# Patient Record
Sex: Female | Born: 1985 | Race: White | Hispanic: Yes | Marital: Married | Smoking: Former smoker
Health system: Southern US, Community
[De-identification: ages and names within clinical notes are randomized; demographics above are authoritative.]

## PROBLEM LIST (undated history)

## (undated) DIAGNOSIS — E079 Disorder of thyroid, unspecified: Secondary | ICD-10-CM

## (undated) DIAGNOSIS — Q2112 Patent foramen ovale: Secondary | ICD-10-CM

## (undated) DIAGNOSIS — Q211 Atrial septal defect: Secondary | ICD-10-CM

## (undated) DIAGNOSIS — F419 Anxiety disorder, unspecified: Secondary | ICD-10-CM

## (undated) DIAGNOSIS — R51 Headache: Secondary | ICD-10-CM

## (undated) DIAGNOSIS — I10 Essential (primary) hypertension: Secondary | ICD-10-CM

## (undated) DIAGNOSIS — G43909 Migraine, unspecified, not intractable, without status migrainosus: Secondary | ICD-10-CM

## (undated) DIAGNOSIS — F32A Depression, unspecified: Secondary | ICD-10-CM

## (undated) DIAGNOSIS — F329 Major depressive disorder, single episode, unspecified: Secondary | ICD-10-CM

## (undated) DIAGNOSIS — T884XXA Failed or difficult intubation, initial encounter: Secondary | ICD-10-CM

## (undated) DIAGNOSIS — Z302 Encounter for sterilization: Secondary | ICD-10-CM

## (undated) HISTORY — PX: CRYOTHERAPY: SHX1416

## (undated) HISTORY — DX: Anxiety disorder, unspecified: F41.9

---

## 2005-08-11 ENCOUNTER — Emergency Department (HOSPITAL_COMMUNITY): Admission: EM | Admit: 2005-08-11 | Discharge: 2005-08-11 | Payer: Self-pay | Admitting: Emergency Medicine

## 2005-08-17 ENCOUNTER — Encounter: Admission: RE | Admit: 2005-08-17 | Discharge: 2005-09-05 | Payer: Self-pay | Admitting: Family Medicine

## 2006-01-26 ENCOUNTER — Other Ambulatory Visit: Admission: RE | Admit: 2006-01-26 | Discharge: 2006-01-26 | Payer: Self-pay | Admitting: Gynecology

## 2007-01-11 HISTORY — PX: DILATION AND EVACUATION: SHX1459

## 2007-02-24 ENCOUNTER — Inpatient Hospital Stay (HOSPITAL_COMMUNITY): Admission: AD | Admit: 2007-02-24 | Discharge: 2007-02-24 | Payer: Self-pay | Admitting: Obstetrics and Gynecology

## 2007-02-27 ENCOUNTER — Ambulatory Visit (HOSPITAL_COMMUNITY): Admission: RE | Admit: 2007-02-27 | Discharge: 2007-02-27 | Payer: Self-pay | Admitting: Obstetrics and Gynecology

## 2007-02-27 ENCOUNTER — Encounter (INDEPENDENT_AMBULATORY_CARE_PROVIDER_SITE_OTHER): Payer: Self-pay | Admitting: Obstetrics and Gynecology

## 2007-08-21 ENCOUNTER — Other Ambulatory Visit: Admission: RE | Admit: 2007-08-21 | Discharge: 2007-08-21 | Payer: Self-pay | Admitting: Gynecology

## 2007-10-22 ENCOUNTER — Ambulatory Visit: Payer: Self-pay | Admitting: Gynecology

## 2008-01-11 HISTORY — PX: WISDOM TOOTH EXTRACTION: SHX21

## 2008-01-28 ENCOUNTER — Encounter: Admission: RE | Admit: 2008-01-28 | Discharge: 2008-01-28 | Payer: Self-pay | Admitting: Gastroenterology

## 2009-01-10 HISTORY — PX: CHOLECYSTECTOMY OPEN: SUR202

## 2009-01-22 ENCOUNTER — Ambulatory Visit (HOSPITAL_COMMUNITY): Admission: AD | Admit: 2009-01-22 | Discharge: 2009-01-22 | Payer: Self-pay | Admitting: Obstetrics and Gynecology

## 2009-01-23 ENCOUNTER — Ambulatory Visit (HOSPITAL_COMMUNITY): Admission: RE | Admit: 2009-01-23 | Discharge: 2009-01-23 | Payer: Self-pay | Admitting: Obstetrics and Gynecology

## 2009-02-03 ENCOUNTER — Encounter (INDEPENDENT_AMBULATORY_CARE_PROVIDER_SITE_OTHER): Payer: Self-pay | Admitting: Obstetrics and Gynecology

## 2009-02-03 ENCOUNTER — Inpatient Hospital Stay (HOSPITAL_COMMUNITY): Admission: AD | Admit: 2009-02-03 | Discharge: 2009-02-05 | Payer: Self-pay | Admitting: Obstetrics and Gynecology

## 2009-02-23 ENCOUNTER — Encounter: Admission: RE | Admit: 2009-02-23 | Discharge: 2009-02-23 | Payer: Self-pay | Admitting: Gastroenterology

## 2009-03-06 ENCOUNTER — Emergency Department (HOSPITAL_COMMUNITY): Admission: EM | Admit: 2009-03-06 | Discharge: 2009-03-06 | Payer: Self-pay | Admitting: Emergency Medicine

## 2009-03-08 ENCOUNTER — Emergency Department (HOSPITAL_COMMUNITY): Admission: EM | Admit: 2009-03-08 | Discharge: 2009-03-08 | Payer: Self-pay | Admitting: Emergency Medicine

## 2009-03-10 ENCOUNTER — Ambulatory Visit (HOSPITAL_COMMUNITY): Admission: RE | Admit: 2009-03-10 | Discharge: 2009-03-10 | Payer: Self-pay | Admitting: General Surgery

## 2010-01-06 LAB — HEPATITIS B SURFACE ANTIGEN: Hepatitis B Surface Ag: NEGATIVE

## 2010-01-06 LAB — TYPE AND SCREEN: Antibody Screen: NEGATIVE

## 2010-01-06 LAB — ABO/RH

## 2010-01-10 NOTE — L&D Delivery Note (Signed)
Delivery Note At 5:55 PM a viable female was delivered via Vaginal, Spontaneous Delivery (Presentation: Right Occiput Anterior).  APGAR: 8, 9; weight 6 lb 12.1 oz (3065 g).   Placenta status: Intact, Spontaneous.  Cord: 3 vessels with the following complications: None.  Cord pH: not done  Anesthesia: Epidural  Episiotomy: None Lacerations: 1st degree Suture Repair: 3-0 vicryl rapide Est. Blood Loss (mL): 300  Mom to postpartum.  Baby to nursery-stable.  Kristen Klein J 08/06/2010, 6:06 PM

## 2010-03-28 LAB — COMPREHENSIVE METABOLIC PANEL
Alkaline Phosphatase: 127 U/L — ABNORMAL HIGH (ref 39–117)
BUN: 8 mg/dL (ref 6–23)
Chloride: 103 mEq/L (ref 96–112)
Creatinine, Ser: 0.41 mg/dL (ref 0.4–1.2)
GFR calc Af Amer: >60 mL/min (ref >60)
Glucose, Bld: 91 mg/dL (ref 70–99)
Potassium: 3.8 mEq/L (ref 3.5–5.1)
Total Bilirubin: 0.7 mg/dL (ref 0.3–1.2)
Total Protein: 6.5 g/dL (ref 6.0–8.3)

## 2010-03-28 LAB — CBC
HCT: 39 % (ref 36.0–46.0)
Hemoglobin: 12.4 g/dL (ref 12.0–15.0)
Hemoglobin: 13.2 g/dL (ref 12.0–15.0)
MCHC: 33.9 g/dL (ref 30.0–36.0)
MCV: 86.2 fL (ref 78.0–100.0)
MCV: 86.6 fL (ref 78.0–100.0)
Platelets: 194 10*3/uL (ref 150–400)
RBC: 4.24 MIL/uL (ref 3.87–5.11)
RDW: 14.7 % (ref 11.5–15.5)
WBC: 14.9 10*3/uL — ABNORMAL HIGH (ref 4.0–10.5)

## 2010-03-28 LAB — URIC ACID: Uric Acid, Serum: 5.6 mg/dL (ref 2.4–7.0)

## 2010-04-02 LAB — LIPASE, BLOOD: Lipase: 30 U/L (ref 11–59)

## 2010-04-02 LAB — COMPREHENSIVE METABOLIC PANEL
ALT: 33 U/L (ref 0–35)
Alkaline Phosphatase: 158 U/L — ABNORMAL HIGH (ref 39–117)
BUN: 12 mg/dL (ref 6–23)
CO2: 25 mEq/L (ref 19–32)
Chloride: 102 mEq/L (ref 96–112)
GFR calc Af Amer: >60 mL/min (ref >60)
Glucose, Bld: 98 mg/dL (ref 70–99)
Potassium: 3.4 mEq/L — ABNORMAL LOW (ref 3.5–5.1)
Total Bilirubin: 0.5 mg/dL (ref 0.3–1.2)

## 2010-04-02 LAB — CBC
HCT: 42.9 % (ref 36.0–46.0)
MCV: 84.5 fL (ref 78.0–100.0)
Platelets: 277 10*3/uL (ref 150–400)
RDW: 13.4 % (ref 11.5–15.5)

## 2010-04-02 LAB — DIFFERENTIAL
Basophils Absolute: 0 10*3/uL (ref 0.0–0.1)
Basophils Relative: 0 % (ref 0–1)
Lymphocytes Relative: 41 % (ref 12–46)
Monocytes Absolute: 0.4 10*3/uL (ref 0.1–1.0)
Monocytes Relative: 5 % (ref 3–12)
Neutro Abs: 4.6 10*3/uL (ref 1.7–7.7)
Neutrophils Relative %: 52 % (ref 43–77)

## 2010-04-02 LAB — URINALYSIS, ROUTINE W REFLEX MICROSCOPIC
Glucose, UA: NEGATIVE mg/dL
Nitrite: NEGATIVE
Specific Gravity, Urine: 1.014 (ref 1.005–1.030)
pH: 6.5 (ref 5.0–8.0)

## 2010-05-25 NOTE — Op Note (Signed)
[name], Kristen Klein           ACCOUNT NO.:  [id number]   MEDICAL RECORD NO.:  [id number]          PATIENT TYPE:  AMB   LOCATION:  SDC                           FACILITY:  [facility]   PHYSICIAN:  Malva Limes, M.D.    DATE OF BIRTH:  1985-05-26   DATE OF PROCEDURE:  02/27/2007  DATE OF DISCHARGE:                               OPERATIVE REPORT   PREOPERATIVE DIAGNOSIS:  Spontaneous abortion.   POSTOPERATIVE DIAGNOSIS:  Spontaneous abortion.   PROCEDURE PERFORMED:  Dilation and evacuation.   SURGEON:  Malva Limes, M.D.   ANESTHESIA:  MAC with paracervical block.   DRAINS:  None.   ANTIBIOTICS:  Ancef 1 gram.   SPECIMENS:  Products of conception sent to pathology.   ESTIMATED BLOOD LOSS:  30 mL.   DESCRIPTION OF PROCEDURE:  The patient was taken to the operating room  where she was placed in the dorsal lithotomy position.  A MAC anesthesia  was then administered.  She was prepped and draped in the usual fashion  for this procedure.  A sterile speculum was placed in the vagina.  Lidocaine 1% 20 mL was used for a paracervical block.  A single toothed  tenaculum was applied to the anterior cervical lip.  The cervix was  serially dilated to a 74 [place].  A 7 mm suction cannula was placed into  the uterine cavity and the products of conception were withdrawn.  Sharp  curettage was performed followed by repeat suction.  The patient was  taken to the Recovery Room in stable condition.  Instrument and lap  counts were correct times one.  Patient will be discharged to home.  She  will be given RhoGAM if RH negative.           ______________________________  Malva Limes, M.D.     MA/MEDQ  D:  02/27/2007  T:  02/27/2007  Job:  [id number]

## 2010-06-22 IMAGING — RF DG CHOLANGIOGRAM OPERATIVE
1 series · 4 of 4 positions shown · non-contrast
Comparison: None

CLINICAL DATA: Symptomatic cholecystitis

INTRAOPERATIVE CHOLANGIOGRAM
TECHNIQUE: Cholangiographic images from the C-arm fluoroscopic
device were submitted for interpretation post-operatively.  Please
see the procedural report for the amount of contrast and the
fluoroscopy time utilized.

[Series 1: run · 4 of 31 frames shown]
[frame 5/31]
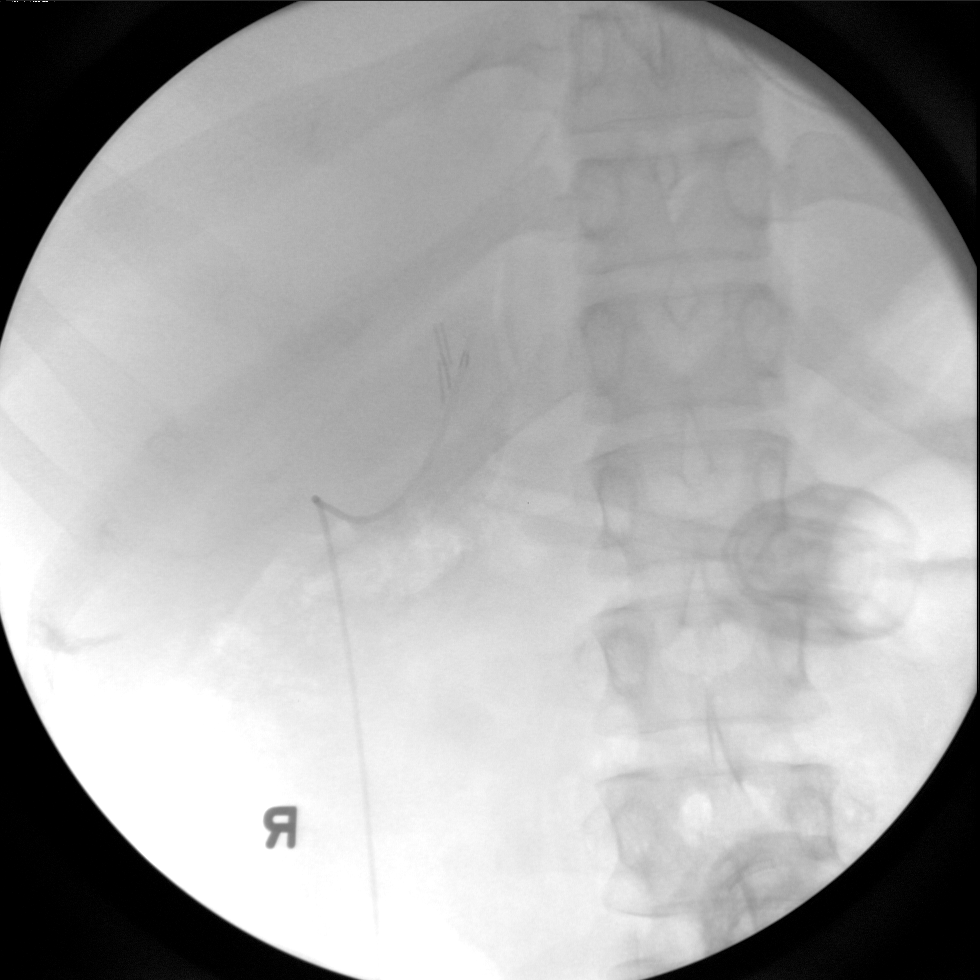
[frame 16/31]
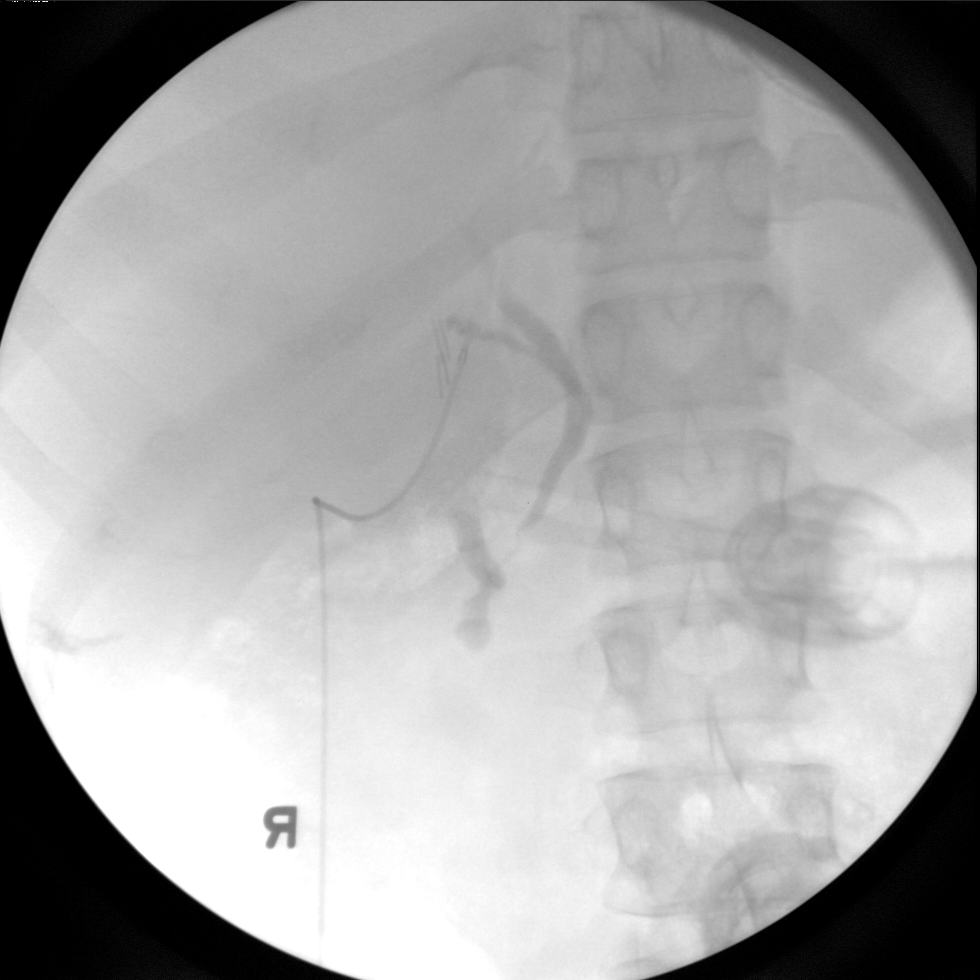
[frame 27/31]
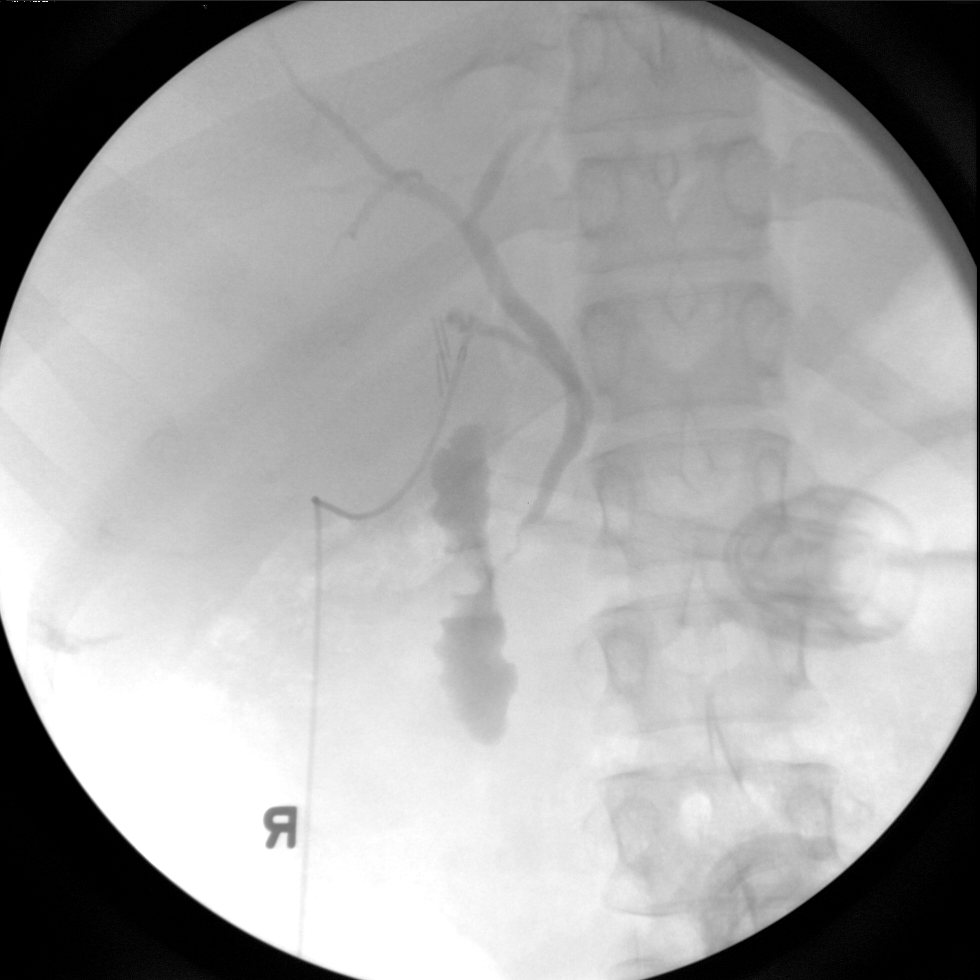
[frame 31/31]
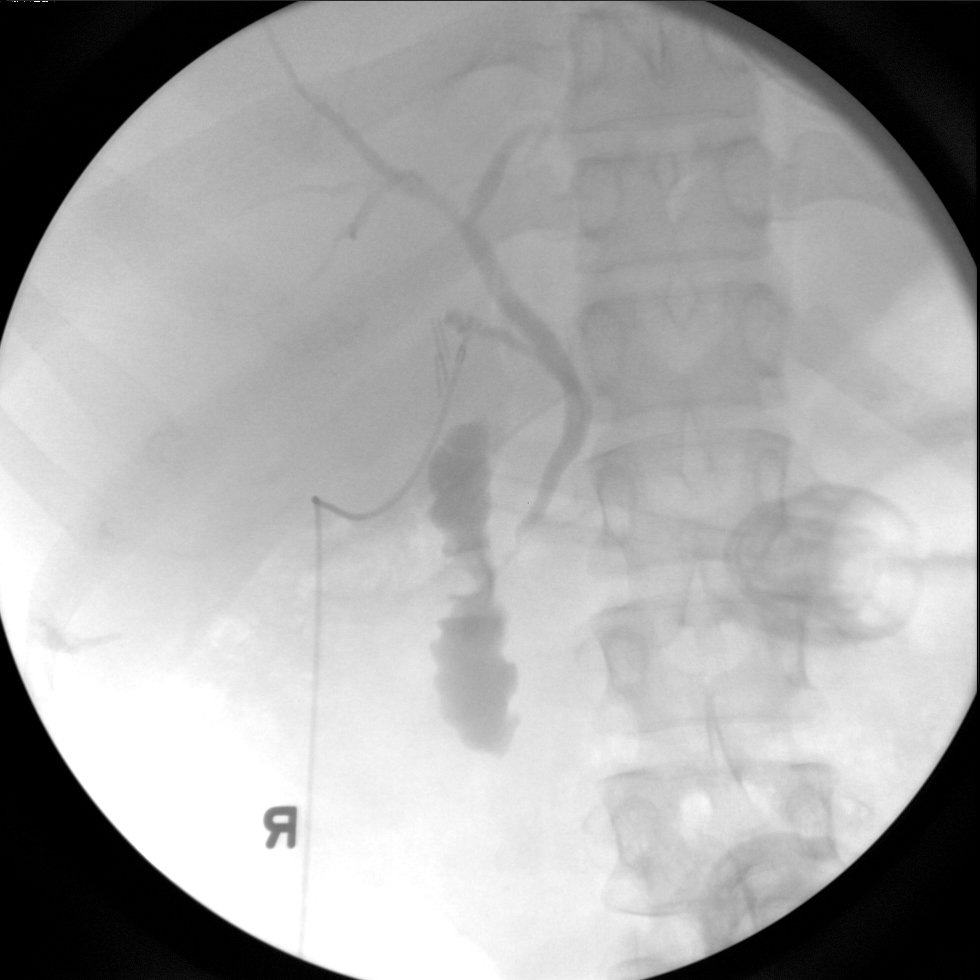

[4 of 4 positions shown; findings below may reference images not displayed]

FINDINGS: No persistent filling defects in the common duct.
Intrahepatic ducts are incompletely visualized, appearing
decompressed centrally. Contrast passes into the duodenum.

IMPRESSION

Negative for retained common duct stone.

## 2010-07-26 ENCOUNTER — Other Ambulatory Visit (HOSPITAL_COMMUNITY): Payer: Self-pay

## 2010-08-06 ENCOUNTER — Inpatient Hospital Stay (HOSPITAL_COMMUNITY): Admitting: Anesthesiology

## 2010-08-06 ENCOUNTER — Encounter (HOSPITAL_COMMUNITY): Payer: Self-pay

## 2010-08-06 ENCOUNTER — Inpatient Hospital Stay (HOSPITAL_COMMUNITY)
Admission: RE | Admit: 2010-08-06 | Discharge: 2010-08-08 | DRG: 775 | Disposition: A | Discharge status: Home / Self Care | Source: Ambulatory Visit | Attending: Obstetrics and Gynecology | Admitting: Obstetrics and Gynecology

## 2010-08-06 ENCOUNTER — Encounter (HOSPITAL_COMMUNITY): Payer: Self-pay | Admitting: Anesthesiology

## 2010-08-06 DIAGNOSIS — O139 Gestational [pregnancy-induced] hypertension without significant proteinuria, unspecified trimester: Principal | ICD-10-CM | POA: Diagnosis present

## 2010-08-06 HISTORY — DX: Essential (primary) hypertension: I10

## 2010-08-06 LAB — COMPREHENSIVE METABOLIC PANEL
AST: 24 U/L (ref 0–37)
BUN: 8 mg/dL (ref 6–23)
CO2: 21 mEq/L (ref 19–32)
Calcium: 9.8 mg/dL (ref 8.4–10.5)
Chloride: 100 mEq/L (ref 96–112)
Creatinine, Ser: <0.47 mg/dL — ABNORMAL LOW (ref 0.50–1.10)
Glucose, Bld: 80 mg/dL (ref 70–99)
Total Bilirubin: 0.4 mg/dL (ref 0.3–1.2)

## 2010-08-06 LAB — CBC
HCT: 38.8 % (ref 36.0–46.0)
Hemoglobin: 13.6 g/dL (ref 12.0–15.0)
MCH: 29.7 pg (ref 26.0–34.0)
MCHC: 35.1 g/dL (ref 30.0–36.0)
MCV: 84.7 fL (ref 78.0–100.0)
RBC: 4.58 MIL/uL (ref 3.87–5.11)

## 2010-08-06 LAB — URIC ACID: Uric Acid, Serum: 5.6 mg/dL (ref 2.4–7.0)

## 2010-08-06 MED ORDER — ONDANSETRON HCL 4 MG/2ML IJ SOLN: 4.0000 mg | Freq: Four times a day (QID) | INTRAMUSCULAR | Status: DC | PRN

## 2010-08-06 MED ORDER — FENTANYL 2.5 MCG/ML BUPIVACAINE 1/10 % EPIDURAL INFUSION (WH - ANES)
14.0000 mL/h | INTRAMUSCULAR | Status: DC
  Administered 2010-08-06 (×3): 14 mL/h via EPIDURAL
  Filled 2010-08-06 (×3): qty 60

## 2010-08-06 MED ORDER — LANOLIN HYDROUS EX OINT: TOPICAL_OINTMENT | CUTANEOUS | Status: DC | PRN

## 2010-08-06 MED ORDER — OXYTOCIN 20 UNITS IN LACTATED RINGERS INFUSION - SIMPLE: 125.0000 mL/h | INTRAVENOUS | Status: DC | PRN

## 2010-08-06 MED ORDER — LIDOCAINE HCL (PF) 1 % IJ SOLN: 30.0000 mL | INTRAMUSCULAR | Status: DC | PRN

## 2010-08-06 MED ORDER — PRENATAL PLUS 27-1 MG PO TABS
1.0000 | ORAL_TABLET | Freq: Every day | ORAL | Status: DC
  Administered 2010-08-07 – 2010-08-08 (×2): 1 via ORAL
  Filled 2010-08-06 (×2): qty 1

## 2010-08-06 MED ORDER — EPHEDRINE 5 MG/ML INJ
10.0000 mg | INTRAVENOUS | Status: DC | PRN
  Administered 2010-08-06: 10 mg via INTRAVENOUS
  Filled 2010-08-06 (×2): qty 4

## 2010-08-06 MED ORDER — NALBUPHINE SYRINGE 5 MG/0.5 ML: 5.0000 mg | INJECTION | INTRAMUSCULAR | Status: DC | PRN

## 2010-08-06 MED ORDER — CITRIC ACID-SODIUM CITRATE 334-500 MG/5ML PO SOLN: 30.0000 mL | ORAL | Status: DC | PRN

## 2010-08-06 MED ORDER — PHENYLEPHRINE 40 MCG/ML (10ML) SYRINGE FOR IV PUSH (FOR BLOOD PRESSURE SUPPORT)
80.0000 ug | PREFILLED_SYRINGE | INTRAVENOUS | Status: DC | PRN
  Filled 2010-08-06: qty 5

## 2010-08-06 MED ORDER — LACTATED RINGERS IV SOLN: 500.0000 mL | Freq: Once | INTRAVENOUS | Status: DC

## 2010-08-06 MED ORDER — LACTATED RINGERS IV SOLN
500.0000 mL | INTRAVENOUS | Status: DC | PRN
  Administered 2010-08-06: 1000 mL via INTRAVENOUS

## 2010-08-06 MED ORDER — ONDANSETRON HCL 4 MG/2ML IJ SOLN: 4.0000 mg | INTRAMUSCULAR | Status: DC | PRN

## 2010-08-06 MED ORDER — ZOLPIDEM TARTRATE 5 MG PO TABS: 5.0000 mg | ORAL_TABLET | Freq: Every evening | ORAL | Status: DC | PRN

## 2010-08-06 MED ORDER — TETANUS-DIPHTH-ACELL PERTUSSIS 5-2.5-18.5 LF-MCG/0.5 IM SUSP: 0.5000 mL | Freq: Once | INTRAMUSCULAR | Status: DC

## 2010-08-06 MED ORDER — DIPHENHYDRAMINE HCL 25 MG PO CAPS: 25.0000 mg | ORAL_CAPSULE | Freq: Four times a day (QID) | ORAL | Status: DC | PRN

## 2010-08-06 MED ORDER — OXYTOCIN 20 UNITS IN LACTATED RINGERS INFUSION - SIMPLE
125.0000 mL/h | Freq: Once | INTRAVENOUS | Status: DC
  Administered 2010-08-06: 850 mL/h via INTRAVENOUS

## 2010-08-06 MED ORDER — OXYCODONE-ACETAMINOPHEN 5-325 MG PO TABS: 2.0000 | ORAL_TABLET | ORAL | Status: DC | PRN

## 2010-08-06 MED ORDER — PHENYLEPHRINE 40 MCG/ML (10ML) SYRINGE FOR IV PUSH (FOR BLOOD PRESSURE SUPPORT)
80.0000 ug | PREFILLED_SYRINGE | INTRAVENOUS | Status: DC | PRN
  Filled 2010-08-06 (×2): qty 5

## 2010-08-06 MED ORDER — EPHEDRINE 5 MG/ML INJ
10.0000 mg | INTRAVENOUS | Status: DC | PRN
  Filled 2010-08-06: qty 4

## 2010-08-06 MED ORDER — DIPHENHYDRAMINE HCL 50 MG/ML IJ SOLN: 12.5000 mg | INTRAMUSCULAR | Status: DC | PRN

## 2010-08-06 MED ORDER — FLEET ENEMA 7-19 GM/118ML RE ENEM: 1.0000 | ENEMA | RECTAL | Status: DC | PRN

## 2010-08-06 MED ORDER — SENNOSIDES-DOCUSATE SODIUM 8.6-50 MG PO TABS
1.0000 | ORAL_TABLET | Freq: Every day | ORAL | Status: DC
  Administered 2010-08-07: 1 via ORAL

## 2010-08-06 MED ORDER — ACETAMINOPHEN 325 MG PO TABS: 650.0000 mg | ORAL_TABLET | ORAL | Status: DC | PRN

## 2010-08-06 MED ORDER — SIMETHICONE 80 MG PO CHEW
80.0000 mg | CHEWABLE_TABLET | ORAL | Status: DC | PRN
  Administered 2010-08-06: 80 mg via ORAL

## 2010-08-06 MED ORDER — LACTATED RINGERS IV SOLN
INTRAVENOUS | Status: DC
  Administered 2010-08-06: 15:00:00 via INTRAVENOUS
  Administered 2010-08-06: 125 mL/h via INTRAVENOUS
  Administered 2010-08-06: 08:00:00 via INTRAVENOUS

## 2010-08-06 MED ORDER — OXYCODONE-ACETAMINOPHEN 5-325 MG PO TABS
1.0000 | ORAL_TABLET | ORAL | Status: DC | PRN
  Administered 2010-08-07: 1 via ORAL
  Filled 2010-08-06: qty 1

## 2010-08-06 MED ORDER — TERBUTALINE SULFATE 1 MG/ML IJ SOLN: 0.2500 mg | Freq: Once | INTRAMUSCULAR | Status: DC | PRN

## 2010-08-06 MED ORDER — LIDOCAINE HCL 1.5 % IJ SOLN
INTRAMUSCULAR | Status: DC | PRN
  Administered 2010-08-06: 5 mL
  Administered 2010-08-06: 2 mL
  Administered 2010-08-06: 5 mL

## 2010-08-06 MED ORDER — IBUPROFEN 600 MG PO TABS
600.0000 mg | ORAL_TABLET | Freq: Four times a day (QID) | ORAL | Status: DC
  Administered 2010-08-06 – 2010-08-08 (×7): 600 mg via ORAL
  Filled 2010-08-06 (×7): qty 1

## 2010-08-06 MED ORDER — IBUPROFEN 600 MG PO TABS
600.0000 mg | ORAL_TABLET | Freq: Four times a day (QID) | ORAL | Status: DC | PRN
  Administered 2010-08-06: 600 mg via ORAL
  Filled 2010-08-06: qty 1

## 2010-08-06 MED ORDER — BENZOCAINE-MENTHOL 20-0.5 % EX AERO: 1.0000 "application " | INHALATION_SPRAY | CUTANEOUS | Status: DC | PRN

## 2010-08-06 MED ORDER — ONDANSETRON HCL 4 MG PO TABS: 4.0000 mg | ORAL_TABLET | ORAL | Status: DC | PRN

## 2010-08-06 MED ORDER — WITCH HAZEL-GLYCERIN EX PADS: MEDICATED_PAD | CUTANEOUS | Status: DC | PRN

## 2010-08-06 MED ORDER — OXYTOCIN 20 UNITS IN LACTATED RINGERS INFUSION - SIMPLE
1.0000 m[IU]/min | INTRAVENOUS | Status: DC
  Administered 2010-08-06: 2 m[IU]/min via INTRAVENOUS
  Filled 2010-08-06: qty 1000

## 2010-08-06 NOTE — H&P (Signed)
Kristen Klein, Kristen Klein            ACCOUNT NO.:  [id number]  MEDICAL RECORD NO.:  [id number]  LOCATION:  9165                          FACILITY:  [facility]  PHYSICIAN:  Lenoard Aden, M.D.DATE OF BIRTH:  06/26/1985  DATE OF ADMISSION:  08/06/2010 DATE OF DISCHARGE:                             HISTORY & PHYSICAL   CHIEF COMPLAINT:  Gestational versus chronic hypertension at 39 weeks for induction.  HISTORY OF PRESENT ILLNESS:  She is a 25 year old Hispanic female G2, P1 at 14 weeks' gestation, who presents now for induction.  She has a past medical history remarkable for medications to include Procardia and prenatal vitamins.  No known drug allergies.  MEDICAL HISTORY:  Remarkable for preterm birth and hypertension associated with pregnancy.  SOCIAL HISTORY:  Noncontributory.  FAMILY HISTORY:  Remarkable for osteoarthritis, diabetes, chronic hypertension, and thyroid disease.  She has a history of a SAB x1 and a spontaneous vaginal delivery of 34 weeks.  SURGICAL HISTORY:  Include cholecystectomy, wisdom tooth extraction, cryosurgery, and D&C for missed AB in 2009.  Prenatal course has been complicated by hypertension for which she was placed on Procardia and maintained and preterm cervical change.  PHYSICAL EXAMINATION:  GENERAL:  She is a well-developed, well-nourished Hispanic female in no acute distress.  HEENT:  Normal. NECK:  Supple.  Full range of motion. LUNGS:  Clear. HEART:  Regular rhythm. ABDOMEN:  Soft, gravid, nontender.  Estimated fetal weight 7 pounds. PELVIS:  Cervix is 4- to 5-cm, 70% vertex -1. EXTREMITIES:  No cords. NEUROLOGIC:  Nonfocal. SKIN:  Intact.  IMPRESSION: 1. 39-week intrauterine pregnancy. 2. Gestational hypertension, well controlled on Procardia.  PLAN:  Admit, check PIH labs, epidural p.r.n., anticipate attempts at vaginal delivery.     Lenoard Aden, M.D.     RJT/MEDQ  D:  08/06/2010  T:  08/06/2010  Job:  [id number]

## 2010-08-06 NOTE — Anesthesia Preprocedure Evaluation (Signed)
Anesthesia Evaluation  Name, MR# and DOB Patient awake  General Assessment Comment  Reviewed: Allergy & Precautions, H&P , Patient's Chart, lab work & pertinent test results and reviewed documented beta blocker date and time   History of Anesthesia Complications Negative for: history of anesthetic complications  Airway Mallampati: II TM Distance: >3 FB     Dental  (+) Teeth Intact   Pulmonaryneg pulmonary ROS    clear to auscultation    Cardiovascular hypertension (preeclampsia), regular Normal   Neuro/PsychNegative Neurological ROS Negative Psych ROS  GI/Hepatic/Renal negative GI ROS, negative Liver ROS, and negative Renal ROS (+)       Endo/Other   (+)  Morbid obesity Abdominal   Musculoskeletal  Hematology negative hematology ROS (+)   Peds  Reproductive/Obstetrics (+) Pregnancy   Anesthesia Other Findings             Anesthesia Physical Anesthesia Plan  ASA: III  Anesthesia Plan: Epidural   Post-op Pain Management:    Induction:   Airway Management Planned:   Additional Equipment:   Intra-op Plan:   Post-operative Plan:   Informed Consent: I have reviewed the patients History and Physical, chart, labs and discussed the procedure including the risks, benefits and alternatives for the proposed anesthesia with the patient or authorized representative who has indicated his/her understanding and acceptance.     Plan Discussed with:   Anesthesia Plan Comments:         Anesthesia Quick Evaluation

## 2010-08-06 NOTE — Anesthesia Procedure Notes (Addendum)
Epidural Patient location during procedure: OB Start time: 08/06/2010 10:23 AM Reason for block: procedure for pain  Staffing Anesthesiologist: Luisantonio Adinolfi L. Performed by: anesthesiologist   Preanesthetic Checklist Completed: patient identified, site marked, surgical consent, pre-op evaluation, timeout performed, IV checked, risks and benefits discussed and monitors and equipment checked  Epidural Patient position: sitting Prep: site prepped and draped and DuraPrep Patient monitoring: continuous pulse ox, blood pressure and heart rate Approach: midline Injection technique: LOR air  Needle:  Needle type: Tuohy  Needle gauge: 17 G Needle length: 9 cm Needle insertion depth: 5 cm cm Catheter type: closed end flexible Catheter size: 19 Gauge Catheter at skin depth: 9 cm Test dose: negative  Assessment Events: blood not aspirated, injection not painful, no injection resistance, negative IV test and no paresthesia  Additional Notes  Heme+ in catheter - cath withdrawn to 9 cm at skin with no heme and negative test dose.  Discussed risk of headache, infection, bleeding, nerve injury and failed or incomplete block.  Patient voices understanding and wishes to proceed.

## 2010-08-06 NOTE — Progress Notes (Signed)
Kristen Klein is a 25 y.o. [id number] at [redacted]w[redacted]d by LMP admitted for induction of labor due to Gestational HTN on Procardia.  Subjective:No complaints  Objective: VSS- afebrile      FHT:  FHR: 150s bpm, variability: moderate,  accelerations:  Present,  decelerations:  Absent UC:   none SVE:   Dilation: 4 Effacement (%): 80 Station: -1 Exam by:: Dr. Billy Coast  Labs: Lab Results  Component Value Date   WBC 11.0* 08/06/2010   HGB 13.6 08/06/2010   HCT 38.8 08/06/2010   MCV 84.7 08/06/2010   PLT 186 08/06/2010    Assessment / Plan: Induction of labor due to gestational hypertension,  progressing well on pitocin  Labor: Progressing normally Preeclampsia:  no signs or symptoms of toxicity Fetal Wellbeing:  Category I Pain Control:  Labor support without medications I/D:  n/a Anticipated MOD:  NSVD  Shynice Sigel J 08/06/2010, 8:51 AM

## 2010-08-07 LAB — CBC
Hemoglobin: 12.3 g/dL (ref 12.0–15.0)
MCH: 29.6 pg (ref 26.0–34.0)
MCHC: 34.7 g/dL (ref 30.0–36.0)
MCV: 85.3 fL (ref 78.0–100.0)
RBC: 4.15 MIL/uL (ref 3.87–5.11)

## 2010-08-07 NOTE — Progress Notes (Signed)
  PPD 1 SVD Female / Gestational hypertension (off procardia)  S:  Reports feeling well / no PIH symptoms             Tolerating po/ No nausea or vomiting             Bleeding is light             Pain controlled withprescription NSAID's including motrin             Up ad lib / ambulatory  Newborn breast feeding  / Circumcision requested today   O:  A & O x 3 NAD             VS: Blood pressure 112/77, pulse 98, temperature 97.6 F (36.4 C), temperature source Oral, resp. rate 18, SpO2 98.00%, unknown if currently breastfeeding.  LABS:  Basename 08/07/10 0511 08/06/10 0820  HGB 12.3 13.6  HCT 35.4* 38.8    I&O: I/O last 3 completed shifts: In: -  Out: 900 [Urine:600; Blood:300]  Net outpt - negative      Lungs: Clear and unlabored  Heart: regular rate and rhythm / no mumurs  Abdomen: soft, non-tender, non-distended, active BS             Fundus: firm, non-tender, U-1  Perineum: mild edema  Lochia: light  Extremities: 1+ edema, no calf pain or tenderness, negative Homans    A/P: PPD # 1 SVD Female / breastfeeding              Gestational hypertension - delivered   Doing well - stable status  Routine post partum orders  Newborn circumcision today by Dr Billy Coast             Anticipate discharge in am - consider HCTZ with increased dependent edema  Kristen Klein 08/07/2010, 10:06 AM

## 2010-08-07 NOTE — Anesthesia Postprocedure Evaluation (Signed)
  Anesthesia Post-op Note  Patient: Kristen Klein  Procedure(s) Performed: * No procedures listed *  Patient stable following vaginal delivery.

## 2010-08-08 MED ORDER — IBUPROFEN 600 MG PO TABS: 600.0000 mg | ORAL_TABLET | Freq: Four times a day (QID) | ORAL | Status: AC

## 2010-08-08 NOTE — Progress Notes (Signed)
  PPD 2 SVD  S:  Reports feeling well             Tolerating po/ No nausea or vomiting             Bleeding is light             Pain controlled withprescription NSAID's including motrin only             Up ad lib / ambulatory  Newborn breast feeding  / Circumcision done   O:  A & O x 3 NAD             VS: Blood pressure 134/82, pulse 83, temperature 98.3 F (36.8 C), temperature source Oral, resp. rate 18, SpO2 98.00%, unknown if currently breastfeeding.  LABS:  Basename 08/07/10 0511 08/06/10 0820  HGB 12.3 13.6  HCT 35.4* 38.8    I&O: I/O last 3 completed shifts: In: -  Out: 600 [Urine:600]      Lungs: Clear and unlabored  Heart: regular rate and rhythm / no mumurs  Abdomen: soft, non-tender, non-distended              Fundus: firm, non-tender, U-2  Perineum: mild edema  Lochia: light  Extremities: slight edema, no calf pain or tenderness, negative Homans    A/P: PPD # 2  Doing well - stable status  Routine post partum orders  Discharge home  [facility] 08/08/2010, 7:57 AM

## 2010-08-08 NOTE — Discharge Summary (Signed)
  Obstetric Discharge Summary Reason for Admission: induction of labor  Prenatal Labs: ABO, Rh:   +/ Rhogam not indicated Antibody: Negative (12/28 0000) Rubella:   immune RPR: NON REACTIVE (07/27 0820)  HBsAg: Negative (12/28 0000)  HIV: Non-reactive (12/28 0000)  GBS: Negative (06/25 0000)   Labor Summary: Labor : Induction of labor with pitocin and AROM / normal labor progression with SVD. Delivery of viable female  via SVD by [place]  Anesthesia: epidural Procedures: none Complications: none  Newborn Data:  Gender: female Feeding method : breast Circumcision: yes Home with mother.  Discharge Information: Date: 08-08-2010 Discharge Diagnoses: Term Pregnancy-delivered / mild gestational hypertension - resolving  Activity: pelvic rest Diet: routine Medications: Ibuprophen Condition: stable Instructions: refer to practice specific booklet Discharge to: home Follow up : [facility] in 1 week for blood pressure check and  6 weeks for routine postpartum

## 2010-08-13 ENCOUNTER — Inpatient Hospital Stay (HOSPITAL_COMMUNITY): Admission: AD | Admit: 2010-08-13 | Payer: Self-pay | Source: Ambulatory Visit | Admitting: Obstetrics and Gynecology

## 2010-10-01 LAB — CBC
HCT: 36.9
MCHC: 35.2
MCV: 84.6
Platelets: 285
RDW: 13

## 2010-10-01 LAB — ABO/RH: ABO/RH(D): O POS

## 2010-12-24 ENCOUNTER — Encounter (HOSPITAL_COMMUNITY): Payer: Self-pay | Admitting: *Deleted

## 2010-12-24 ENCOUNTER — Emergency Department (HOSPITAL_COMMUNITY)
Admission: EM | Admit: 2010-12-24 | Discharge: 2010-12-24 | Disposition: A | Discharge status: Home / Self Care | Payer: BC Managed Care – PPO | Attending: Emergency Medicine | Admitting: Emergency Medicine

## 2010-12-24 ENCOUNTER — Other Ambulatory Visit: Payer: Self-pay

## 2010-12-24 ENCOUNTER — Emergency Department (HOSPITAL_COMMUNITY): Payer: BC Managed Care – PPO

## 2010-12-24 DIAGNOSIS — R079 Chest pain, unspecified: Secondary | ICD-10-CM | POA: Insufficient documentation

## 2010-12-24 DIAGNOSIS — R000 Tachycardia, unspecified: Secondary | ICD-10-CM

## 2010-12-24 DIAGNOSIS — I10 Essential (primary) hypertension: Secondary | ICD-10-CM | POA: Insufficient documentation

## 2010-12-24 LAB — URINALYSIS, ROUTINE W REFLEX MICROSCOPIC
Bilirubin Urine: NEGATIVE
Glucose, UA: NEGATIVE mg/dL
Ketones, ur: NEGATIVE mg/dL
Leukocytes, UA: NEGATIVE
Nitrite: NEGATIVE
Protein, ur: NEGATIVE mg/dL
Specific Gravity, Urine: 1.024 (ref 1.005–1.030)
Urobilinogen, UA: 0.2 mg/dL (ref 0.0–1.0)
pH: 5.5 (ref 5.0–8.0)

## 2010-12-24 LAB — DIFFERENTIAL
Basophils Absolute: 0 10*3/uL (ref 0.0–0.1)
Basophils Relative: 0 % (ref 0–1)
Eosinophils Absolute: 0.1 10*3/uL (ref 0.0–0.7)
Eosinophils Relative: 1 % (ref 0–5)
Lymphocytes Relative: 35 % (ref 12–46)
Lymphs Abs: 2.7 10*3/uL (ref 0.7–4.0)
Monocytes Absolute: 0.4 10*3/uL (ref 0.1–1.0)
Monocytes Relative: 5 % (ref 3–12)
Neutro Abs: 4.5 10*3/uL (ref 1.7–7.7)
Neutrophils Relative %: 58 % (ref 43–77)

## 2010-12-24 LAB — URINE MICROSCOPIC-ADD ON

## 2010-12-24 LAB — CBC
Hemoglobin: 13.8 g/dL (ref 12.0–15.0)
MCH: 29.7 pg (ref 26.0–34.0)
MCHC: 36 g/dL (ref 30.0–36.0)
RDW: 12.2 % (ref 11.5–15.5)

## 2010-12-24 LAB — D-DIMER, QUANTITATIVE: D-Dimer, Quant: <0.22 ug/mL-FEU (ref 0.00–0.48)

## 2010-12-24 MED ORDER — SODIUM CHLORIDE 0.9 % IV BOLUS (SEPSIS)
500.0000 mL | Freq: Once | INTRAVENOUS | Status: AC
  Administered 2010-12-24: 1000 mL via INTRAVENOUS

## 2010-12-24 MED ORDER — SODIUM CHLORIDE 0.9 % IV SOLN
INTRAVENOUS | Status: DC
  Administered 2010-12-24: 12:00:00 via INTRAVENOUS

## 2010-12-24 NOTE — ED Notes (Signed)
CHest pressure started yesterday while driving, radiated to shoulder and left arm, no nausea. Now pressure substernal  Radiates to back. No change with palpation, .

## 2010-12-24 NOTE — ED Provider Notes (Signed)
History     CSN: [id number] Arrival date & time: 12/24/2010 10:30 AM   First MD Initiated Contact with Patient 12/24/10 1108      Chief Complaint  Patient presents with  . Hypertension    (Consider location/radiation/quality/duration/timing/severity/associated sxs/prior treatment) Patient is a 25 y.o. female presenting with hypertension. The history is provided by the patient.  Hypertension Associated symptoms include chest pain. Pertinent negatives include no abdominal pain, no headaches and no shortness of breath.   patient states that she is having chest pain since Monday. It starts in her upper chest and radiates to the back and into her left arm. It is dull. She is 4 months postpartum. No cough. No real dyspnea. No fevers. No trauma. Around Thanksgiving time she states she had some cold symptoms. She's not had pains like this before. She states she's checking her blood pressure on Monday and spent elevated. She had a history of hypertension with pregnancy. Does not smoke. No swelling in her legs.   Past Medical History  Diagnosis Date  . Hypertension     Past Surgical History  Procedure Date  . Wisdom tooth extraction 2010  . Dilation and evacuation 2009  . Cholecystectomy open 2011    No family history on file.  History  Substance Use Topics  . Smoking status: Never Smoker   . Smokeless tobacco: Not on file  . Alcohol Use: No    OB History    Grav Para Term Preterm Abortions TAB SAB Ect Mult Living   3 2 1 1 1  0 1 0 0 2      Review of Systems  Constitutional: Negative for activity change and appetite change.  HENT: Negative for neck stiffness.   Eyes: Negative for pain.  Respiratory: Negative for chest tightness and shortness of breath.   Cardiovascular: Positive for chest pain. Negative for leg swelling.  Gastrointestinal: Negative for nausea, vomiting, abdominal pain and diarrhea.  Genitourinary: Negative for flank pain.  Musculoskeletal: Negative for  back pain.  Skin: Negative for rash.  Neurological: Negative for weakness, numbness and headaches.  Psychiatric/Behavioral: Negative for behavioral problems.    Allergies  Review of patient's allergies indicates no known allergies.  Home Medications   Current Outpatient Rx  Name Route Sig Dispense Refill  . PHENTERMINE HCL 37.5 MG PO CAPS Oral Take 37.5 mg by mouth every morning.        BP 145/86  Pulse 105  Temp(Src) 97.9 F (36.6 C) (Oral)  Resp 19  Wt 160 lb (72.576 kg)  SpO2 100%  Physical Exam  Nursing note and vitals reviewed. Constitutional: She is oriented to person, place, and time. She appears well-developed and well-nourished.  HENT:  Head: Normocephalic and atraumatic.  Eyes: EOM are normal. Pupils are equal, round, and reactive to light.  Neck: Normal range of motion. Neck supple.  Cardiovascular: Regular rhythm and normal heart sounds.   No murmur heard.      Tachycardia  Pulmonary/Chest: Effort normal and breath sounds normal. No respiratory distress. She has no wheezes. She has no rales.  Abdominal: Soft. Bowel sounds are normal. She exhibits no distension. There is no tenderness. There is no rebound and no guarding.  Musculoskeletal: Normal range of motion.  Neurological: She is alert and oriented to person, place, and time. No cranial nerve deficit.  Skin: Skin is warm and dry.  Psychiatric: She has a normal mood and affect. Her speech is normal.    ED Course  Procedures (including critical  care time)  Labs Reviewed  URINALYSIS, ROUTINE W REFLEX MICROSCOPIC - Abnormal; Notable for the following:    Hgb urine dipstick SMALL (*)    All other components within normal limits  URINE MICROSCOPIC-ADD ON - Abnormal; Notable for the following:    Squamous Epithelial / LPF FEW (*)    Bacteria, UA FEW (*)    All other components within normal limits  CBC  DIFFERENTIAL  PREGNANCY, URINE  D-DIMER, QUANTITATIVE  TROPONIN I   Dg Chest 2 View  12/24/2010   *RADIOLOGY REPORT*  Clinical Data: Shortness of breath.  CHEST - 2 VIEW  Comparison: 03/06/2009.  Findings: Trachea is midline.  Heart size normal.  Lungs are clear. No pleural fluid.  IMPRESSION: No acute findings.  Original Report Authenticated By: Reyes Ivan, M.D.     1. Tachycardia   2. Chest pain      Date: 12/24/2010  Rate:100  Rhythm: sinus tachycardia  QRS Axis: normal  Intervals: normal  ST/T Wave abnormalities: normal  Conduction Disutrbances:none  Narrative Interpretation: rate increased. U waves resolved.  Old EKG Reviewed: changes noted    MDM  Patient's had high blood pressure and some chest pain. She is tachycardic. Dull chest pain. Chest x-ray is reassuring. EKG only shows mild tachycardia. D-dimer was negative. She had recently been on phentermine. She's not now. I'll have her followup with cardiology.        Juliet Rude. Rubin Payor, MD 12/24/10 1410

## 2010-12-24 NOTE — ED Notes (Signed)
Pt stats "I started checking my blood pressure Monday, yesterday I started having chest pain that radiates into my left arm, into my back and around the chest, it has been ongoing"; denies n/v

## 2010-12-26 ENCOUNTER — Other Ambulatory Visit: Payer: Self-pay

## 2010-12-26 ENCOUNTER — Encounter (HOSPITAL_COMMUNITY): Payer: Self-pay | Admitting: Adult Health

## 2010-12-26 ENCOUNTER — Emergency Department (HOSPITAL_COMMUNITY)
Admission: EM | Admit: 2010-12-26 | Discharge: 2010-12-26 | Disposition: A | Discharge status: Home / Self Care | Payer: BC Managed Care – PPO | Attending: Emergency Medicine | Admitting: Emergency Medicine

## 2010-12-26 DIAGNOSIS — Z9089 Acquired absence of other organs: Secondary | ICD-10-CM | POA: Insufficient documentation

## 2010-12-26 DIAGNOSIS — R079 Chest pain, unspecified: Secondary | ICD-10-CM | POA: Insufficient documentation

## 2010-12-26 LAB — POCT I-STAT TROPONIN I: Troponin i, poc: 0 ng/mL (ref 0.00–0.08)

## 2010-12-26 MED ORDER — HYDROCODONE-ACETAMINOPHEN 5-325 MG PO TABS: 1.0000 | ORAL_TABLET | ORAL | Status: AC | PRN

## 2010-12-26 MED ORDER — GI COCKTAIL ~~LOC~~
30.0000 mL | Freq: Once | ORAL | Status: AC
  Administered 2010-12-26: 30 mL via ORAL
  Filled 2010-12-26: qty 30

## 2010-12-26 NOTE — ED Notes (Signed)
Chest pain that began Friday, symptoms have increased. Chest pressure is worse while lying flat and worse with movement. Pain radiates from sternum into left arm and back. Pt also reports SOB with activity.

## 2010-12-26 NOTE — ED Notes (Signed)
EKG completed and given to Dr. Glick.

## 2010-12-26 NOTE — ED Provider Notes (Signed)
History     CSN: [id number] Arrival date & time: 12/26/2010 10:12 AM   First MD Initiated Contact with Patient 12/26/10 1043      Chief Complaint  Patient presents with  . Chest Pain    (Consider location/radiation/quality/duration/timing/severity/associated sxs/prior treatment) Patient is a 25 y.o. female presenting with chest pain. The history is provided by the patient.  Chest Pain The chest pain began 3 - 5 days ago. Chest pain occurs constantly. The pain is associated with exertion (It increases with supine position.). The quality of the pain is described as aching, heavy and pressure-like. Pertinent negatives for primary symptoms include no fever, no shortness of breath, no cough, no palpitations and no nausea. She tried NSAIDs for the symptoms.  Pertinent negatives for past medical history include no diabetes, no hyperlipidemia and no hypertension.  Her family medical history is significant for hypertension in family.  Pertinent negatives for family medical history include: no CAD in family and no diabetes in family.     Past Medical History  Diagnosis Date  . Hypertension     Past Surgical History  Procedure Date  . Wisdom tooth extraction 2010  . Dilation and evacuation 2009  . Cholecystectomy open 2011    History reviewed. No pertinent family history.  History  Substance Use Topics  . Smoking status: Never Smoker   . Smokeless tobacco: Not on file  . Alcohol Use: No    OB History    Grav Para Term Preterm Abortions TAB SAB Ect Mult Living   3 2 1 1 1  0 1 0 0 2      Review of Systems  Constitutional: Negative for fever and chills.  HENT: Negative.   Respiratory: Negative.  Negative for cough and shortness of breath.   Cardiovascular: Positive for chest pain. Negative for palpitations.  Gastrointestinal: Negative.  Negative for nausea.  Musculoskeletal: Negative.   Skin: Negative.   Neurological: Negative.     Allergies  Review of patient's  allergies indicates no known allergies.  Home Medications  No current outpatient prescriptions on file.  BP 129/80  Pulse 80  Temp(Src) 98.2 F (36.8 C) (Oral)  Resp 16  SpO2 100%  Physical Exam  Constitutional: She appears well-developed and well-nourished.  HENT:  Head: Normocephalic.  Neck: Normal range of motion. Neck supple.  Cardiovascular: Normal rate and regular rhythm.   Pulmonary/Chest: Effort normal and breath sounds normal. She exhibits no tenderness.  Abdominal: Soft. Bowel sounds are normal. There is no tenderness. There is no rebound and no guarding.  Musculoskeletal: Normal range of motion.  Neurological: She is alert. No cranial nerve deficit.  Skin: Skin is warm and dry. No rash noted.  Psychiatric: She has a normal mood and affect.    ED Course  Procedures (including critical care time)   Labs Reviewed  I-STAT TROPONIN I   Dg Chest 2 View  12/24/2010  *RADIOLOGY REPORT*  Clinical Data: Shortness of breath.  CHEST - 2 VIEW  Comparison: 03/06/2009.  Findings: Trachea is midline.  Heart size normal.  Lungs are clear. No pleural fluid.  IMPRESSION: No acute findings.  Original Report Authenticated By: Reyes Ivan, M.D.     No diagnosis found.    MDM  GI cocktail without change in symptoms. Troponin repeated and found to be negative. Will discharge home and she can keep scheduled appointment with cardiology on Tuesday of this week.        Rodena Medin, PA 12/26/10 [phone]

## 2010-12-26 NOTE — ED Notes (Signed)
ekg was done in triage and given to [facility] edp

## 2010-12-26 NOTE — ED Provider Notes (Signed)
Medical screening examination/treatment/procedure(s) were performed by non-physician practitioner and as supervising physician I was immediately available for consultation/collaboration.   Laray Anger, DO 12/26/10 1932

## 2011-04-12 ENCOUNTER — Encounter (HOSPITAL_COMMUNITY): Payer: Self-pay

## 2011-04-12 ENCOUNTER — Emergency Department (HOSPITAL_COMMUNITY)
Admission: EM | Admit: 2011-04-12 | Discharge: 2011-04-13 | Disposition: A | Discharge status: Home / Self Care | Payer: PRIVATE HEALTH INSURANCE | Attending: Emergency Medicine | Admitting: Emergency Medicine

## 2011-04-12 DIAGNOSIS — I10 Essential (primary) hypertension: Secondary | ICD-10-CM | POA: Insufficient documentation

## 2011-04-12 DIAGNOSIS — R45851 Suicidal ideations: Secondary | ICD-10-CM | POA: Insufficient documentation

## 2011-04-12 DIAGNOSIS — E0590 Thyrotoxicosis, unspecified without thyrotoxic crisis or storm: Secondary | ICD-10-CM | POA: Insufficient documentation

## 2011-04-12 DIAGNOSIS — F329 Major depressive disorder, single episode, unspecified: Secondary | ICD-10-CM | POA: Insufficient documentation

## 2011-04-12 DIAGNOSIS — F3289 Other specified depressive episodes: Secondary | ICD-10-CM | POA: Insufficient documentation

## 2011-04-12 DIAGNOSIS — F32A Depression, unspecified: Secondary | ICD-10-CM

## 2011-04-12 DIAGNOSIS — Z79899 Other long term (current) drug therapy: Secondary | ICD-10-CM | POA: Insufficient documentation

## 2011-04-12 HISTORY — DX: Patent foramen ovale: Q21.12

## 2011-04-12 HISTORY — DX: Major depressive disorder, single episode, unspecified: F32.9

## 2011-04-12 HISTORY — DX: Depression, unspecified: F32.A

## 2011-04-12 HISTORY — DX: Atrial septal defect: Q21.1

## 2011-04-12 HISTORY — DX: Disorder of thyroid, unspecified: E07.9

## 2011-04-12 LAB — COMPREHENSIVE METABOLIC PANEL
ALT: 17 U/L (ref 0–35)
AST: 19 U/L (ref 0–37)
Albumin: 4.1 g/dL (ref 3.5–5.2)
Alkaline Phosphatase: 78 U/L (ref 39–117)
Chloride: 102 mEq/L (ref 96–112)
Potassium: 3.5 mEq/L (ref 3.5–5.1)
Sodium: 134 mEq/L — ABNORMAL LOW (ref 135–145)
Total Protein: 7.6 g/dL (ref 6.0–8.3)

## 2011-04-12 LAB — CBC
MCHC: 35.8 g/dL (ref 30.0–36.0)
RDW: 12.3 % (ref 11.5–15.5)
WBC: 8.2 10*3/uL (ref 4.0–10.5)

## 2011-04-12 LAB — URINALYSIS, ROUTINE W REFLEX MICROSCOPIC
Glucose, UA: NEGATIVE mg/dL
Ketones, ur: NEGATIVE mg/dL
Leukocytes, UA: NEGATIVE
Nitrite: NEGATIVE
Protein, ur: NEGATIVE mg/dL

## 2011-04-12 LAB — RAPID URINE DRUG SCREEN, HOSP PERFORMED
Amphetamines: NOT DETECTED
Barbiturates: NOT DETECTED

## 2011-04-12 LAB — ETHANOL: Alcohol, Ethyl (B): <11 mg/dL (ref 0–11)

## 2011-04-12 LAB — ACETAMINOPHEN LEVEL: Acetaminophen (Tylenol), Serum: <15 ug/mL (ref 10–30)

## 2011-04-12 LAB — POCT PREGNANCY, URINE: Preg Test, Ur: NEGATIVE

## 2011-04-12 MED ORDER — IBUPROFEN 600 MG PO TABS
600.0000 mg | ORAL_TABLET | Freq: Three times a day (TID) | ORAL | Status: DC | PRN
  Administered 2011-04-12: 600 mg via ORAL
  Filled 2011-04-12: qty 1

## 2011-04-12 MED ORDER — LISINOPRIL 10 MG PO TABS
10.0000 mg | ORAL_TABLET | Freq: Every day | ORAL | Status: DC
  Filled 2011-04-12 (×2): qty 1

## 2011-04-12 MED ORDER — ESCITALOPRAM OXALATE 10 MG PO TABS
10.0000 mg | ORAL_TABLET | Freq: Every day | ORAL | Status: DC
Start: 2011-04-12 — End: 2011-04-13
  Filled 2011-04-12: qty 1

## 2011-04-12 MED ORDER — ONDANSETRON HCL 4 MG PO TABS: 4.0000 mg | ORAL_TABLET | Freq: Three times a day (TID) | ORAL | Status: DC | PRN

## 2011-04-12 MED ORDER — ACETAMINOPHEN 325 MG PO TABS: 650.0000 mg | ORAL_TABLET | ORAL | Status: DC | PRN

## 2011-04-12 MED ORDER — ALUM & MAG HYDROXIDE-SIMETH 200-200-20 MG/5ML PO SUSP: 30.0000 mL | ORAL | Status: DC | PRN

## 2011-04-12 MED ORDER — ALPRAZOLAM 0.5 MG PO TABS: 0.5000 mg | ORAL_TABLET | Freq: Two times a day (BID) | ORAL | Status: DC

## 2011-04-12 MED ORDER — ZOLPIDEM TARTRATE 5 MG PO TABS: 5.0000 mg | ORAL_TABLET | Freq: Every evening | ORAL | Status: DC | PRN

## 2011-04-12 MED ORDER — LORAZEPAM 1 MG PO TABS: 1.0000 mg | ORAL_TABLET | Freq: Three times a day (TID) | ORAL | Status: DC | PRN

## 2011-04-12 NOTE — ED Notes (Signed)
Oriented pt. To psych ed.  Pt. Denies S/H/I, A/V/H.  States that she has a 24 yr. & 12 month old and her husband is away in the [employer], has been having overwhelming feelings of depression r/t having to handle many things at home that she doesn't aways share with her husband because she doesn't want him to worry since he has some many things to worry about in the [employer].   About a week ago pt states that she was driving her car and thought about ending it all and realized that these were not good thoughts to have and contact a Dr. For some help.

## 2011-04-12 NOTE — ED Notes (Signed)
Pt. Belonging in locker#3 in triage (2 Bags).

## 2011-04-12 NOTE — ED Notes (Signed)
Pt sister visited and took pt's car keys with her when she left with pt's permission.

## 2011-04-12 NOTE — ED Notes (Signed)
Pt has been getting treatment for about 2 wks for major depression and thoughts of SI.  Felt overwhelmed today at work and had to leave early to come here for the feelings.  Her therapist told her to come here.

## 2011-04-12 NOTE — ED Provider Notes (Addendum)
History     CSN: [id number]  Arrival date & time 04/12/11  1654   First MD Initiated Contact with Patient 04/12/11 1804      No chief complaint on file.   (Consider location/radiation/quality/duration/timing/severity/associated sxs/prior treatment) Patient is a 26 y.o. female presenting with mental health disorder. The history is provided by the patient. No language interpreter was used.  Mental Health Problem The primary symptoms do not include hallucinations. Primary symptoms comment: depression with suicidal thoughts - no plan The current episode started 1 to 2 weeks ago. This is a new problem.  The onset of the illness is precipitated by a stressful event and emotional stress. The degree of incapacity that she is experiencing as a consequence of her illness is moderate. Sequelae of the illness include an inability to care for self. Additional symptoms of the illness do not include no insomnia, no hypersomnia, no appetite change, no fatigue, no headaches or no abdominal pain. She admits to suicidal ideas. She does not have a plan to commit suicide. She does not contemplate harming herself. She has not already injured self. She does not contemplate injuring another person. She has not already  injured another person. Risk factors that are present for mental illness include a history of mental illness.    Past Medical History  Diagnosis Date  . Hypertension   . Thyroid disease     hyperthyroid  . PFO (patent foramen ovale)     Past Surgical History  Procedure Date  . Wisdom tooth extraction 2010  . Dilation and evacuation 2009  . Cholecystectomy open 2011  . Cryotherapy     History reviewed. No pertinent family history.  History  Substance Use Topics  . Smoking status: Former Games developer  . Smokeless tobacco: Not on file  . Alcohol Use: Yes     occasionally    OB History    Grav Para Term Preterm Abortions TAB SAB Ect Mult Living   3 2 1 1 1  0 1 0 0 2      Review of  Systems  Constitutional: Negative for fever, activity change, appetite change and fatigue.  HENT: Negative for congestion, sore throat, rhinorrhea, neck pain and neck stiffness.   Respiratory: Negative for cough and shortness of breath.   Cardiovascular: Negative for chest pain and palpitations.  Gastrointestinal: Negative for nausea, vomiting and abdominal pain.  Genitourinary: Negative for dysuria, urgency, frequency and flank pain.  Musculoskeletal: Negative for myalgias, back pain and arthralgias.  Neurological: Negative for dizziness, weakness, light-headedness, numbness and headaches.  Psychiatric/Behavioral: Positive for suicidal ideas. Negative for hallucinations and self-injury. The patient is nervous/anxious. The patient does not have insomnia.   All other systems reviewed and are negative.    Allergies  Review of patient's allergies indicates no known allergies.  Home Medications   Current Outpatient Rx  Name Route Sig Dispense Refill  . ALPRAZOLAM 0.5 MG PO TABS Oral Take 0.5 mg by mouth 2 (two) times daily.    Marland Kitchen ESCITALOPRAM OXALATE 10 MG PO TABS Oral Take 10 mg by mouth daily.    Marland Kitchen NUVARING VA Vaginal Place 1 application vaginally every 30 (thirty) days.    Marland Kitchen LISINOPRIL 10 MG PO TABS Oral Take 10 mg by mouth daily.    . ALEVE PO Oral Take 2 tablets by mouth daily as needed.      BP 133/77  Pulse 84  Temp(Src) 98.2 F (36.8 C) (Oral)  Resp 18  SpO2 99%  Breastfeeding? No  Physical Exam  Nursing note and vitals reviewed. Constitutional: She is oriented to person, place, and time. She appears well-developed and well-nourished. No distress.  HENT:  Head: Normocephalic and atraumatic.  Mouth/Throat: Oropharynx is clear and moist.  Eyes: Conjunctivae and EOM are normal. Pupils are equal, round, and reactive to light.  Neck: Normal range of motion. Neck supple.  Cardiovascular: Normal rate, regular rhythm, normal heart sounds and intact distal pulses.  Exam reveals  no gallop and no friction rub.   No murmur heard. Pulmonary/Chest: Effort normal and breath sounds normal. No respiratory distress.  Abdominal: Soft. Bowel sounds are normal. There is no tenderness.  Musculoskeletal: Normal range of motion. She exhibits no edema and no tenderness.  Neurological: She is alert and oriented to person, place, and time. No cranial nerve deficit.  Skin: Skin is warm and dry.  Psychiatric: Her affect is not blunt and not inappropriate. She is slowed. She is is not hyperactive and not actively hallucinating. Thought content is not paranoid and not delusional. She exhibits a depressed mood. She expresses suicidal ideation. She expresses no homicidal ideation. She expresses no suicidal plans and no homicidal plans.    ED Course  Procedures (including critical care time)   Labs Reviewed  URINE RAPID DRUG SCREEN (HOSP PERFORMED)  POCT PREGNANCY, URINE  CBC  COMPREHENSIVE METABOLIC PANEL  ETHANOL  ACETAMINOPHEN LEVEL  URINALYSIS, ROUTINE W REFLEX MICROSCOPIC   No results found.   1. Depression   2. Suicidal ideation       MDM  Depression with suicidal ideation. No specific plan. Denies homicidal ideation and psychosis. Medical screening labs were obtained and the patient is medically clear for psychiatric evaluation. Psychiatric orders were placed. Her home meds were placed. The ACT team will evaluate the patient.  Will dispo per recommendations        Dayton Bailiff, MD 04/12/11 1850  Pt was seen by Dr Jacky Kindle, telepsych.  She has been deemed ok to discharge, to continue outpatient therapy and continue Lexapro  Olivia Mackie, MD 04/13/11 [phone]

## 2011-04-12 NOTE — BH Assessment (Signed)
Assessment Note   Kristen Klein is a 26 y.o. female who presents to [facility] with depression/SI.  Pt reports the following: pt became overwhelmed at work today; SI thoughts presents at the time, pt says she went to the bathroom at work and "choked self", not harm self but "feel something, I felt numb".  Pt admits that while at work she was staring at a pair of scissors for a brief moment.  Pt. Says has had SI thoughts only for 3-4 wks with no specific plan, has started therapy sessions(Bonnie Kyung Rudd) in the last 2 wks and is also seeing a psych for med mgt(Meridith Baker).  Pt.'s stressors: spouse is in the [employer] and has been gone for 3 wks, will return in 2 wks.  Pt says the constant separation is difficult and the couple never had time bond with each other.  Pt says they were married quickly, had children quickly and he was away a lot.  Pt describes her depressive sxs: irritability, inability to focus/concentrate, crying spells and impatience  Pt currently denies SI and feels safe going home, states family member can stay with her to keep her safe while her mother takes care of her children.  telepsych will be requested for further disposition.   Axis I: Major Depression, single episode Axis II: Deferred Axis III:  Past Medical History  Diagnosis Date  . Hypertension   . Thyroid disease     hyperthyroid  . PFO (patent foramen ovale)   . Depression    Axis IV: other psychosocial or environmental problems and problems related to social environment Axis V: 41-50 serious symptoms  Past Medical History:  Past Medical History  Diagnosis Date  . Hypertension   . Thyroid disease     hyperthyroid  . PFO (patent foramen ovale)   . Depression     Past Surgical History  Procedure Date  . Wisdom tooth extraction 2010  . Dilation and evacuation 2009  . Cholecystectomy open 2011  . Cryotherapy     Family History: History reviewed. No pertinent family history.  Social History:  reports  that she has quit smoking. She does not have any smokeless tobacco history on file. She reports that she drinks alcohol. She reports that she does not use illicit drugs.  Additional Social History:  Alcohol / Drug Use Pain Medications: None  Prescriptions: None  Over the Counter: None  History of alcohol / drug use?: No history of alcohol / drug abuse Longest period of sobriety (when/how long): None  Allergies: No Known Allergies  Home Medications:  Medications Prior to Admission  Medication Dose Route Frequency Provider Last Rate Last Dose  . acetaminophen (TYLENOL) tablet 650 mg  650 mg Oral Q4H PRN Dayton Bailiff, MD      . ALPRAZolam Prudy Feeler) tablet 0.5 mg  0.5 mg Oral BID Dayton Bailiff, MD      . alum & mag hydroxide-simeth (MAALOX/MYLANTA) 200-200-20 MG/5ML suspension 30 mL  30 mL Oral PRN Dayton Bailiff, MD      . escitalopram (LEXAPRO) tablet 10 mg  10 mg Oral Daily Dayton Bailiff, MD      . ibuprofen (ADVIL,MOTRIN) tablet 600 mg  600 mg Oral Q8H PRN Dayton Bailiff, MD   600 mg at 04/12/11 1902  . lisinopril (PRINIVIL,ZESTRIL) tablet 10 mg  10 mg Oral Daily Dayton Bailiff, MD      . LORazepam (ATIVAN) tablet 1 mg  1 mg Oral Q8H PRN Dayton Bailiff, MD      .  ondansetron (ZOFRAN) tablet 4 mg  4 mg Oral Q8H PRN Dayton Bailiff, MD      . zolpidem [facility]) tablet 5 mg  5 mg Oral QHS PRN Dayton Bailiff, MD       Medications Prior to Admission  Medication Sig Dispense Refill  . escitalopram (LEXAPRO) 10 MG tablet Take 10 mg by mouth daily.      . Etonogestrel-Ethinyl Estradiol (NUVARING VA) Place 1 application vaginally every 30 (thirty) days.      Marland Kitchen lisinopril (PRINIVIL,ZESTRIL) 10 MG tablet Take 10 mg by mouth daily.        OB/GYN Status:  No LMP recorded. Patient is not currently having periods (Reason: Other).  General Assessment Data Location of Assessment: [facility] ED Living Arrangements: Spouse/significant other;Children Can pt return to current living arrangement?: Yes Admission Status: Voluntary Is  patient capable of signing voluntary admission?: Yes Transfer [facility] Referral Source: MD  Education Status Is patient currently in school?: No Current Grade: None  Highest grade of school patient has completed: Unk  Name of school: [employer]  Contact person: None   Risk to self Suicidal Ideation: No (Pt has had SI thoughts only x3-4 wks ) Is patient at risk for suicide?: No Suicidal Plan?: No Access to Means: Yes Specify Access to Suicidal Means: Pills, Sharps  What has been your use of drugs/alcohol within the last 12 months?: None  Previous Attempts/Gestures: No How many times?: 0  Other Self Harm Risks: None  Triggers for Past Attempts: None known Intentional Self Injurious Behavior: None Family Suicide History: No Recent stressful life event(s): Other (Comment) (Spouse in Military) Persecutory voices/beliefs?: No Depression: Yes Depression Symptoms: Tearfulness;Loss of interest in usual pleasures;Feeling worthless/self pity;Feeling angry/irritable Substance abuse history and/or treatment for substance abuse?: No Suicide prevention information given to non-admitted patients: Not applicable  Risk to Others Homicidal Ideation: No Thoughts of Harm to Others: No Current Homicidal Intent: No Current Homicidal Plan: No Access to Homicidal Means: No Identified Victim: None  History of harm to others?: No Assessment of Violence: None Noted Violent Behavior Description: None  Does patient have access to weapons?: No Criminal Charges Pending?: No Does patient have a court date: No  Psychosis Hallucinations: None noted Delusions: None noted  Mental Status Report Appear/Hygiene: Other (Comment) (Appropriate ) Eye Contact: Good Motor Activity: Unremarkable Speech: Logical/coherent Level of Consciousness: Alert Mood: Depressed;Sad Affect: Depressed;Sad Anxiety Level: None Thought Processes: Coherent;Relevant Judgement: Unimpaired Orientation:  Person;Place;Time;Situation Obsessive Compulsive Thoughts/Behaviors: None  Cognitive Functioning Concentration: Decreased Memory: Recent Intact;Remote Intact IQ: Average Insight: Fair Impulse Control: Fair Appetite: Fair Weight Loss: 0  Weight Gain: 0  Sleep: Decreased Total Hours of Sleep: 5  Vegetative Symptoms: None  Prior Inpatient Therapy Prior Inpatient Therapy: No Prior Therapy Dates: None Prior Therapy Facilty/Provider(s): None Reason for Treatment: None  Prior Outpatient Therapy Prior Outpatient Therapy: Yes Prior Therapy Dates: Current Prior Therapy Facilty/Provider(s): [facility]  Reason for Treatment: Med Mgt; Therapy  ADL Screening (condition at time of admission) Patient's cognitive ability adequate to safely complete daily activities?: Yes Patient able to express need for assistance with ADLs?: Yes Independently performs ADLs?: Yes Weakness of Legs: None Weakness of Arms/Hands: None  Home Assistive Devices/Equipment Home Assistive Devices/Equipment: None  Therapy Consults (therapy consults require a physician order) PT Evaluation Needed: No OT Evalulation Needed: No SLP Evaluation Needed: No Abuse/Neglect Assessment (Assessment to be complete while patient is alone) Physical Abuse: Denies Verbal Abuse: Denies Sexual Abuse: Denies Exploitation of patient/patient's resources: Denies Self-Neglect:  Denies Values / Beliefs Cultural Requests During Hospitalization: None Spiritual Requests During Hospitalization: None Consults Spiritual Care Consult Needed: No Social Work Consult Needed: No Merchant navy officer (For Healthcare) Advance Directive: Patient does not have advance directive;Patient would not like information Pre-existing out of facility DNR order (yellow form or pink MOST form): No    Additional Information 1:1 In Past 12 Months?: No CIRT Risk: No Elopement Risk: No Does patient have medical clearance?: Yes     Disposition:    Disposition Disposition of Patient: Referred to (Telepsych) Patient referred to: Other (Comment) (Telepsych for disposition)  On Site Evaluation by:   Reviewed with Physician:     Murrell Redden 04/12/2011 10:05 PM

## 2011-04-13 NOTE — ED Notes (Signed)
Discharged instructions reviewed with pt who verbalizes understanding. Pt to f/u with therapist OP.

## 2011-04-13 NOTE — Discharge Instructions (Signed)
Continue taking your Lexapro.  Continue outpatient therapy.  Return to the ER for worsening condition or new concerning symptoms.  Depression You have signs of depression. This is a common problem. It can occur at any age. It is often hard to recognize. People can suffer from depression and still have moments of enjoyment. Depression interferes with your basic ability to function in life. It upsets your relationships, sleep, eating, and work habits. CAUSES  Depression is believed to be caused by an imbalance in brain chemicals. It may be triggered by an unpleasant event. Relationship crises, a death in the family, financial worries, retirement, or other stressors are normal causes of depression. Depression may also start for no known reason. Other factors that may play a part include medical illnesses, some medicines, genetics, and alcohol or drug abuse. SYMPTOMS   Feeling unhappy or worthless.   Long-lasting (chronic) tiredness or worn-out feeling.   Self-destructive thoughts and actions.   Not being able to sleep or sleeping too much.   Eating more than usual or not eating at all.   Headaches or feeling anxious.   Trouble concentrating or making decisions.   Unexplained physical problems and substance abuse.  TREATMENT  Depression usually gets better with treatment. This can include:  Antidepressant medicines. It can take weeks before the proper dose is achieved and benefits are reached.   Talking with a therapist, clergyperson, counselor, or friend. These people can help you gain insight into your problem and regain control of your life.   Eating a good diet.   Getting regular physical exercise, such as walking for 30 minutes every day.   Not abusing alcohol or drugs.  Treating depression often takes 6 months or longer. This length of treatment is needed to keep symptoms from returning. Call your caregiver and arrange for follow-up care as suggested. SEEK IMMEDIATE MEDICAL CARE  IF:   You start to have thoughts of hurting yourself or others.   Call your local emergency services (911 in [place]   Go to your local medical emergency department.   Call the National Suicide Prevention Lifeline: 1-800-273-TALK 5026654197).  Document Released: 12/27/2004 Document Revised: 12/16/2010 Document Reviewed: 05/29/2009 [facility] Patient Information 2012 [place], [place]. Suicidal Feelings, How to Help Yourself Everyone feels sad or unhappy at times, but depressing thoughts and feelings of hopelessness can lead to thoughts of suicide. It can seem as if life is too tough to handle. It is as if the mountain is just too high and your climbing skills are not great enough. At that moment these dark thoughts and feelings may seem overwhelming and never ending. It is important to remember these feelings are temporary! They will go away. If you feel as though you have reached the point where suicide is the only answer, it is time to let someone know immediately. This is the first step to feeling better. The following steps will move you to safer ground and lead you in a positive direction out of depression. HOW TO COPE AND PREVENT SUICIDE  Let family, friends, teachers and/or counselors know. Get help. Try not to isolate yourself from those who care about you. Even though you may not feel sociable or think that you are not good company, talk with someone everyday. It is best if it is face to face. Remember, they will want to help you.   Eat a regularly spaced and well-balanced diet, and get plenty of rest.   Avoid alcohol and drugs because they will only make you  feel worse and may also lower your inhibitions. Remove them from the home. If you are thinking of taking an overdose of your prescribed medications, give your medicines to someone who can give them to you one day at a time. If you are on antidepressants, let your caregiver know of your feelings so he or she can provide a safer  medication, if that is a concern.   Remove weapons or poisons from your home.   Try to stick to routines. That may mean just walking the dog or feeding the cat. Follow a schedule and remind yourself that you have to keep that schedule every day. Play with your pets. If it is possible, and you do not have a pet, get one. They give you a sense of well-being, lower your blood pressure and make your heart feel good. They need you, and we all want to be needed.   Set some realistic goals and achieve them. Make a list and cross things off as you go. Accomplishments give a sense of worth. Wait until you are feeling better before doing things you find difficult or unpleasant to do.   If you are able, try to start exercising. Even half-hour periods of exercise each day will make you feel better. Getting out in the sun or into nature helps you recover from depression faster. If you have a favorite place to walk, take advantage of that.   Increase safe activities that have always given you pleasure. This may include playing your favorite music, reading a good book, painting a picture or playing your favorite instrument. Do whatever takes your mind off your depression and puts a smile on your face.   Keep your living space well lit with windows open, and let the sun shine in. Bright light definitely treats depression, not just people with the seasonal affective disorders (SAD).  Above all else remember, depression is temporary. It will go away. Do not contemplate suicide. Death as a permanent solution is not the answer. Suicide will take away the beautiful rest of life, and do lifelong harm to those around you who love you. Help is available. National Suicide Help Lines with 24 hour help are: 1-800-SUICIDE 858-687-4087 Document Released: 07/03/2002 Document Revised: 12/16/2010 Document Reviewed: 11/21/2006 [facility] Patient Information 2012 [place], [place].

## 2011-04-13 NOTE — ED Notes (Signed)
Departs unit in safe and stable condition.

## 2011-04-13 NOTE — ED Notes (Signed)
Two bags of belongings returned to pt

## 2011-04-13 NOTE — ED Notes (Signed)
Patient is resting comfortably. 

## 2011-04-27 ENCOUNTER — Other Ambulatory Visit (HOSPITAL_COMMUNITY)

## 2011-05-06 ENCOUNTER — Encounter (HOSPITAL_COMMUNITY): Admission: RE | Payer: Self-pay | Source: Ambulatory Visit

## 2011-05-06 ENCOUNTER — Ambulatory Visit (HOSPITAL_COMMUNITY)
Admission: RE | Admit: 2011-05-06 | Payer: BC Managed Care – PPO | Source: Ambulatory Visit | Admitting: Obstetrics & Gynecology

## 2011-05-06 SURGERY — ESSURE TUBAL STERILIZATION
Anesthesia: Choice

## 2011-11-28 ENCOUNTER — Encounter (HOSPITAL_COMMUNITY): Payer: Self-pay | Admitting: Pharmacist

## 2011-12-01 ENCOUNTER — Other Ambulatory Visit: Payer: Self-pay | Admitting: Obstetrics & Gynecology

## 2011-12-06 ENCOUNTER — Encounter (HOSPITAL_COMMUNITY)
Admission: RE | Admit: 2011-12-06 | Discharge: 2011-12-06 | Disposition: A | Discharge status: Home / Self Care | Payer: BC Managed Care – PPO | Source: Ambulatory Visit | Attending: Obstetrics & Gynecology | Admitting: Obstetrics & Gynecology

## 2011-12-06 ENCOUNTER — Encounter (HOSPITAL_COMMUNITY): Payer: Self-pay

## 2011-12-06 HISTORY — DX: Headache: R51

## 2011-12-06 LAB — COMPREHENSIVE METABOLIC PANEL
AST: 24 U/L (ref 0–37)
Albumin: 3.8 g/dL (ref 3.5–5.2)
Alkaline Phosphatase: 58 U/L (ref 39–117)
BUN: 10 mg/dL (ref 6–23)
Potassium: 3.6 mEq/L (ref 3.5–5.1)
Total Protein: 7.7 g/dL (ref 6.0–8.3)

## 2011-12-06 LAB — CBC
HCT: 37.4 % (ref 36.0–46.0)
MCHC: 34.8 g/dL (ref 30.0–36.0)
Platelets: 266 10*3/uL (ref 150–400)
RDW: 13.1 % (ref 11.5–15.5)
WBC: 8.3 10*3/uL (ref 4.0–10.5)

## 2011-12-06 NOTE — Patient Instructions (Addendum)
Your procedure is scheduled on:12/09/11  Enter through the Main Entrance at :1130 am Pick up desk phone and dial [phone] and inform us of your arrival.  Please call [phone] if you have any problems the morning of surgery.  Remember: Do not eat after midnight:Thursday Only clear liquids until 9am Fri   Take these meds the morning of surgery with a sip of water:BP med  DO NOT wear jewelry, eye make-up, lipstick,body lotion, or dark fingernail polish. Do not shave for 48 hours prior to surgery.  Patients discharged on the day of surgery will not be allowed to drive home.

## 2011-12-09 ENCOUNTER — Encounter (HOSPITAL_COMMUNITY): Payer: Self-pay

## 2011-12-09 ENCOUNTER — Ambulatory Visit (HOSPITAL_COMMUNITY)
Admission: RE | Admit: 2011-12-09 | Discharge: 2011-12-09 | Disposition: A | Discharge status: Home / Self Care | Payer: BC Managed Care – PPO | Source: Ambulatory Visit | Attending: Obstetrics & Gynecology | Admitting: Obstetrics & Gynecology

## 2011-12-09 ENCOUNTER — Encounter (HOSPITAL_COMMUNITY): Payer: Self-pay | Admitting: *Deleted

## 2011-12-09 ENCOUNTER — Encounter (HOSPITAL_COMMUNITY): Payer: Self-pay | Admitting: Obstetrics & Gynecology

## 2011-12-09 ENCOUNTER — Ambulatory Visit (HOSPITAL_COMMUNITY): Payer: BC Managed Care – PPO

## 2011-12-09 ENCOUNTER — Encounter (HOSPITAL_COMMUNITY)
Admission: RE | Disposition: A | Discharge status: Home / Self Care | Payer: Self-pay | Source: Ambulatory Visit | Attending: Obstetrics & Gynecology

## 2011-12-09 DIAGNOSIS — Z302 Encounter for sterilization: Secondary | ICD-10-CM

## 2011-12-09 DIAGNOSIS — T884XXA Failed or difficult intubation, initial encounter: Secondary | ICD-10-CM

## 2011-12-09 HISTORY — DX: Failed or difficult intubation, initial encounter: T88.4XXA

## 2011-12-09 HISTORY — DX: Encounter for sterilization: Z30.2

## 2011-12-09 HISTORY — PX: DILITATION & CURRETTAGE/HYSTROSCOPY WITH ESSURE: SHX5573

## 2011-12-09 LAB — PREGNANCY, URINE: Preg Test, Ur: NEGATIVE

## 2011-12-09 SURGERY — DILATATION & CURETTAGE/HYSTEROSCOPY WITH ESSURE
Anesthesia: General | Site: Uterus | Wound class: Clean Contaminated

## 2011-12-09 MED ORDER — CHLOROPROCAINE HCL 1 % IJ SOLN
INTRAMUSCULAR | Status: DC | PRN
  Administered 2011-12-09: 10 mL

## 2011-12-09 MED ORDER — ONDANSETRON HCL 4 MG/2ML IJ SOLN
INTRAMUSCULAR | Status: AC
  Filled 2011-12-09: qty 2

## 2011-12-09 MED ORDER — KETOROLAC TROMETHAMINE 30 MG/ML IJ SOLN
INTRAMUSCULAR | Status: DC | PRN
  Administered 2011-12-09: 30 mg via INTRAVENOUS

## 2011-12-09 MED ORDER — FENTANYL CITRATE 0.05 MG/ML IJ SOLN
25.0000 ug | INTRAMUSCULAR | Status: DC | PRN
  Administered 2011-12-09: 50 ug via INTRAVENOUS
  Administered 2011-12-09: 14:00:00 via INTRAVENOUS

## 2011-12-09 MED ORDER — MIDAZOLAM HCL 5 MG/5ML IJ SOLN
INTRAMUSCULAR | Status: DC | PRN
  Administered 2011-12-09: 2 mg via INTRAVENOUS

## 2011-12-09 MED ORDER — MEPERIDINE HCL 25 MG/ML IJ SOLN: 6.2500 mg | INTRAMUSCULAR | Status: DC | PRN

## 2011-12-09 MED ORDER — CEFAZOLIN SODIUM-DEXTROSE 2-3 GM-% IV SOLR
INTRAVENOUS | Status: AC
  Filled 2011-12-09: qty 50

## 2011-12-09 MED ORDER — SUCCINYLCHOLINE CHLORIDE 20 MG/ML IJ SOLN
INTRAMUSCULAR | Status: AC
  Filled 2011-12-09: qty 20

## 2011-12-09 MED ORDER — FENTANYL CITRATE 0.05 MG/ML IJ SOLN
INTRAMUSCULAR | Status: DC | PRN
  Administered 2011-12-09 (×2): 50 ug via INTRAVENOUS

## 2011-12-09 MED ORDER — KETOROLAC TROMETHAMINE 30 MG/ML IJ SOLN
INTRAMUSCULAR | Status: AC
  Filled 2011-12-09: qty 1

## 2011-12-09 MED ORDER — LIDOCAINE HCL (CARDIAC) 20 MG/ML IV SOLN
INTRAVENOUS | Status: AC
  Filled 2011-12-09: qty 5

## 2011-12-09 MED ORDER — FENTANYL CITRATE 0.05 MG/ML IJ SOLN
INTRAMUSCULAR | Status: AC
  Administered 2011-12-09: 50 ug via INTRAVENOUS
  Filled 2011-12-09: qty 2

## 2011-12-09 MED ORDER — PROPOFOL 10 MG/ML IV EMUL
INTRAVENOUS | Status: AC
  Filled 2011-12-09: qty 20

## 2011-12-09 MED ORDER — BUPIVACAINE HCL (PF) 0.25 % IJ SOLN
INTRAMUSCULAR | Status: AC
  Filled 2011-12-09: qty 30

## 2011-12-09 MED ORDER — FENTANYL CITRATE 0.05 MG/ML IJ SOLN
INTRAMUSCULAR | Status: AC
  Filled 2011-12-09: qty 2

## 2011-12-09 MED ORDER — CHLOROPROCAINE HCL 1 % IJ SOLN
INTRAMUSCULAR | Status: AC
  Filled 2011-12-09: qty 30

## 2011-12-09 MED ORDER — ROCURONIUM BROMIDE 50 MG/5ML IV SOLN
INTRAVENOUS | Status: AC
  Filled 2011-12-09: qty 1

## 2011-12-09 MED ORDER — PROMETHAZINE HCL 12.5 MG PO TABS: 12.5000 mg | ORAL_TABLET | Freq: Four times a day (QID) | ORAL | Status: DC | PRN

## 2011-12-09 MED ORDER — PROPOFOL 10 MG/ML IV EMUL
INTRAVENOUS | Status: DC | PRN
  Administered 2011-12-09 (×2): 200 mg via INTRAVENOUS

## 2011-12-09 MED ORDER — METOCLOPRAMIDE HCL 5 MG/ML IJ SOLN: 10.0000 mg | Freq: Once | INTRAMUSCULAR | Status: DC | PRN

## 2011-12-09 MED ORDER — LACTATED RINGERS IV SOLN
INTRAVENOUS | Status: DC
  Administered 2011-12-09: 14:00:00 via INTRAVENOUS
  Administered 2011-12-09: 125 mL/h via INTRAVENOUS

## 2011-12-09 MED ORDER — MIDAZOLAM HCL 2 MG/2ML IJ SOLN
INTRAMUSCULAR | Status: AC
  Filled 2011-12-09: qty 2

## 2011-12-09 MED ORDER — SUCCINYLCHOLINE CHLORIDE 20 MG/ML IJ SOLN
INTRAMUSCULAR | Status: DC | PRN
  Administered 2011-12-09: 100 mg via INTRAVENOUS

## 2011-12-09 MED ORDER — CEFAZOLIN SODIUM-DEXTROSE 2-3 GM-% IV SOLR
2.0000 g | INTRAVENOUS | Status: AC
  Administered 2011-12-09: 2 g via INTRAVENOUS

## 2011-12-09 MED ORDER — SODIUM CHLORIDE 0.9 % IR SOLN
Status: DC | PRN
  Administered 2011-12-09: 1

## 2011-12-09 MED ORDER — LIDOCAINE HCL (CARDIAC) 20 MG/ML IV SOLN
INTRAVENOUS | Status: DC | PRN
  Administered 2011-12-09: 30 mg via INTRAVENOUS

## 2011-12-09 SURGICAL SUPPLY — 14 items
CATH ROBINSON RED A/P 16FR (CATHETERS) ×3 IMPLANT
CLOTH BEACON ORANGE TIMEOUT ST (SAFETY) ×3 IMPLANT
DRESSING TELFA 8X3 (GAUZE/BANDAGES/DRESSINGS) ×3 IMPLANT
GLOVE BIO SURGEON STRL SZ7 (GLOVE) ×3 IMPLANT
GLOVE BIOGEL PI IND STRL 7.0 (GLOVE) ×4 IMPLANT
GLOVE BIOGEL PI INDICATOR 7.0 (GLOVE) ×2
GOWN PREVENTION PLUS LG XLONG (DISPOSABLE) ×4 IMPLANT
GOWN STRL REIN XL XLG (GOWN DISPOSABLE) ×5 IMPLANT
KIT ESSURE FALLOPIAN CLOSURE (Ring) ×3 IMPLANT
PACK HYSTEROSCOPY LF (CUSTOM PROCEDURE TRAY) ×3 IMPLANT
PAD OB MATERNITY 4.3X12.25 (PERSONAL CARE ITEMS) ×3 IMPLANT
PROTECTOR NERVE ULNAR (MISCELLANEOUS) ×3 IMPLANT
TOWEL OR 17X24 6PK STRL BLUE (TOWEL DISPOSABLE) ×6 IMPLANT
WATER STERILE IRR 1000ML POUR (IV SOLUTION) ×3 IMPLANT

## 2011-12-09 NOTE — Op Note (Signed)
Preoperative diagnosis: Undesired fertility, desires permanent sterilzation  Postop diagnosis: same Procedure: Hysteroscopic Essure sterlization Anesthesia: General endotracheal (difficult intubation) Surgeon: Shea Evans, MD  Assistant: none IV fluids: 1800 cc LR  Estimated blood loss: none  Urine output: straight catheter preop  100 cc Saline irrigation fluid deficit 75 cc Complications: none  Condition: stable  Disposition: PACU  Specimen: None  Procedure  Indication: 26 yo, [id number], desired permanent sterilization with Essure and if needed Laparoscopic tubal sterilization. She declined all reversible contraceptive options. CREST data with risk of regret reviewed and also discussed need to have HSG in 3 months to confirm bilateral tubal blockage. She understood and agreed. Patient was counseled on risks/ complications including infection, bleeding, damage to internal organs, she understood and agrees, gave informed written consent.  Patient was brought to the operating room with IV running. She received 30mg  IV Toradol prior to surgery. Time out was carried out. She received preop 1 gm Ancef. She underwent general anesthesia via intubation that was done with fiber-optic assistance since there was difficulty. She was given dorsolithotomy position. Parts were prepped and draped in standard fashion. Bladder was catheterized once. Bimanual exam revealed uterus to be anteverted and normal size. Speculum was placed and cervix was grasped with single-tooth tenaculum. Cervical block with 10 cc 1% plain Nesacaine given. The uterus was sounded to 8 cm. Cervical os was noted to be slightly open, enough for Essure Hysteroscope.   Hysteroscope was introduced in the uterine cavity under vision, using Saline for irrigation. Findings: noted normal endometrial cavity and both tubal ostia. Essure pack was opened.  Right tubal ostium was brought in vision and Essure insert was introduced in standard  fashion without difficulty. Left tubal ostium was brought in vision and Essure insert was introduced in standard fashion without difficulty. Both sides has 1 coil trailing out at the ostia.  Hysteroscope was removed under vision.  Fluid deficit 75 cc.   All counts are correct x2. Patient was brought to the recovery room in stable condition.  Patient will be discharged home today. Follow up in 2 weeks in office. Warning signs of infection and excessive bleeding reviewed.   V.Jerald Hennington, MD.

## 2011-12-09 NOTE — Transfer of Care (Signed)
Immediate Anesthesia Transfer of Care Note  Patient: Kristen Klein  Procedure(s) Performed: Procedure(s) (LRB) with comments: DILATATION & CURETTAGE/HYSTEROSCOPY WITH ESSURE (N/A) LAPAROSCOPIC TUBAL LIGATION (Bilateral)  Patient Location: PACU  Anesthesia Type:General  Level of Consciousness: awake, alert , oriented and patient cooperative  Airway & Oxygen Therapy: Patient Spontanous Breathing and Patient connected to nasal cannula oxygen  Post-op Assessment: Report given to PACU RN and Post -op Vital signs reviewed and stable  Post vital signs: Reviewed and stable  Complications: No apparent anesthesia complications

## 2011-12-09 NOTE — H&P (Signed)
Kristen Klein is an 26 y.o. female [phone], here for permanent sterilization with Essure and possible Lap tubal ligation.  Menses regular, on continuous Nuvaring for [facility] and will continue for 3 mo.post Essure until HSG notes bilateral block.  Abnormal Pap and cryo hx, but recent nl Paps 2 SVDs, 1 SAB.  HTN, well controlled Depression on meds, denies s/s now.  No STDs.    Past Medical History  Diagnosis Date  . Hypertension   . Thyroid disease     hyperthyroid  . PFO (patent foramen ovale)   . Depression   . Headache     Past Surgical History  Procedure Date  . Wisdom tooth extraction 2010  . Dilation and evacuation 2009  . Cholecystectomy open 2011  . Cryotherapy     No family history on file.  Social History:  reports that she has quit smoking. She does not have any smokeless tobacco history on file. She reports that she drinks alcohol. She reports that she does not use illicit drugs.  Allergies: No Known Allergies  No prescriptions prior to admission    Review of Systems  Constitutional: Negative for fever.  Respiratory: Negative for shortness of breath.   Cardiovascular: Negative for chest pain.  Gastrointestinal: Negative for heartburn.  Neurological: Negative for headaches.  Psychiatric/Behavioral: Negative for depression.   There were no vitals taken for this visit.  Physical Exam A&O x 3, no acute distress. Pleasant HEENT neg, no thyromegaly Lungs CTA bilat CV RRR, S1S2 normal Abdo soft, non tender, non acute Extr no edema/ tenderness Pelvic Normal uterus, cervix, adnexa.    Assessment/Plan: 26 yo, G3P2012, wants permanent sterilization with Essure, and if not successful, wants Lap tubal sterilization. Risks/complications and need for 3 mo HSG reviewed, and to continue [facility] until then.  Especially reviewed risk of regret per [place] data in young women. She understands.     Broden Holt R 12/09/2011, 8:59 AM

## 2011-12-09 NOTE — Discharge Instructions (Addendum)
Transcervical Hysteroscopic Sterilization Transcervical hysteroscopic sterilizationis a procedure performed to permanently prevent pregnancy. Transcervical means the procedure is done though the cervix, so no cut (incision) is needed. Tiny coils (microinserts) are placed in the fallopian tubes. After the microinserts are placed, scar tissue forms in the fallopian tubes. The scar tissue will not allow an egg to reach the uterus. If an egg cannot reach the uterus, sperm cannot fertilize it.  You must be very sure you do not want to get pregnant. Deciding to have a permanent sterilization is a big decision. Take your time and do not decide under stress. Women who make this decision before age 55 also tend to have later regrets. Talk about the procedure with your partner. Things to know:  This procedure is not considered effective birth control for at least 3 months.  You will need an additional procedure (hysterosalpingiogram, HSG) to confirm the tubes are blocked.  You will need to use another form of birth control for at least 3 months.  If your tubes are not blocked after 3 months, you will need to talk with your caregiver about options. LET YOUR CAREGIVER KNOW ABOUT:  Gynecological history, including previous gynecological surgeries or procedures, recent pregnancies, and previous birth control use, including pills and intrauterine devices (IUDs).  Allergies. This includes any problems or reactions to metals.  All medicines you are taking including vitamins, herbs, over-the-counter medicines, and creams.  Use of steroids (by mouth or as creams).  Previous problems with numbing medicines.  Previous bleeding problems.  Previous surgeries.  Other health problems, including diabetes and kidney problems.  Possibility of pregnancy.  Recent pelvic infections. RISKS AND COMPLICATIONS  A hole (perforation) in the uterus or fallopian tube.  Allergic reaction to the coils used to block  the fallopian tubes.  The coil falls out (extrusion).  Infection.  Bleeding.  Chronic or acute pelvicpain.  Painful menstrual periods.  A pregnancy that grows inside a fallopian tube instead of the uterus (ectopic pregnancy).  One or both fallopian tubes are not fully blocked. BEFORE THE PROCEDURE  You may need to have a pregnancy test.  Ask your caregiver about changing or stopping your regular medicines.  You may need to keep track of your menstrual cycle. This procedure works best when it is done about 7 days after your period starts.  Talk to your caregiver about birth control.  If you are not using birth control, you may need to start 2 to 3 weeks before the procedure. This makes the procedure easier and ensures that you are not pregnant. PROCEDURE This procedure takes about 30 minutes. It may be done in your caregiver's office or in an outpatient clinic.   You will be awake during the procedure.  You may be given a medicine to numb your cervix (local anesthetic).  A long, thin telescope with a camera (hysteroscope) will be put into your vagina, then through your cervix, and into the uterus.  The openings to both fallopian tubes are seen with the hysteroscope.  Through the hysteroscope, microinserts are put into the fallopian tube openings. They unwind once they are in place. They do not block the openings to the fallopian tubes, but over time, the microinserts will scar the tubes shut. AFTER THE PROCEDURE  You may be able to go home right away.  You may have mild cramps.  You may have some mild bleeding or discharge from your vagina for a few days.  In 3 months, you will need to  have an X-ray taken. This test is done to make sure your fallopian tubes are completely blocked. Document Released: 09/14/2010 Document Revised: 03/21/2011 Document Reviewed: 09/14/2010 [facility] Patient Information 2013 [place], [place]. DISCHARGE INSTRUCTIONS: HYSTEROSCOPY / ENDOMETRIAL  ABLATION The following instructions have been prepared to help you care for yourself upon your return home.  Personal hygiene:  Use sanitary pads for vaginal drainage, not tampons.  Shower the day after your procedure.  NO tub baths, pools or Jacuzzis for 2-3 weeks.  Wipe front to back after using the bathroom.  Activity and limitations:  Do NOT drive or operate any equipment for 24 hours. The effects of anesthesia are still present and drowsiness may result.  Do NOT rest in bed all day.  Walking is encouraged.  Walk up and down stairs slowly.  You may resume your normal activity in one to two days or as indicated by your physician. Sexual activity: NO intercourse for at least 2 weeks after the procedure, or as indicated by your Doctor.  Diet: Eat a light meal as desired this evening. You may resume your usual diet tomorrow.  Return to Work: You may resume your work activities in one to two days or as indicated by Therapist, sports.  What to expect after your surgery: Expect to have vaginal bleeding/discharge for 2-3 days and spotting for up to 10 days. It is not unusual to have soreness for up to 1-2 weeks. You may have a slight burning sensation when you urinate for the first day. Mild cramps may continue for a couple of days. You may have a regular period in 2-6 weeks.  Call your doctor for any of the following:  Excessive vaginal bleeding or clotting, saturating and changing one pad every hour.  Inability to urinate 6 hours after discharge from hospital.  Pain not relieved by pain medication.  Fever of 100.4 F or greater.  Unusual vaginal discharge or odor.  Return to office _________________Call for an appointment ___________________ Patients signature: ______________________ Nurses signature ________________________  Post Anesthesia Care Unit [phone]

## 2011-12-09 NOTE — Anesthesia Postprocedure Evaluation (Signed)
  Anesthesia Post-op Note  Patient: Kristen Klein  Procedure(s) Performed: Procedure(s) (LRB) with comments: DILATATION & CURETTAGE/HYSTEROSCOPY WITH ESSURE (N/A)  Patient Location: PACU  Anesthesia Type:General  Level of Consciousness: awake, alert  and oriented  Airway and Oxygen Therapy: Patient Spontanous Breathing  Post-op Pain: none  Post-op Assessment: Post-op Vital signs reviewed, Patient's Cardiovascular Status Stable, Respiratory Function Stable, Patent Airway, No signs of Nausea or vomiting and Pain level controlled  Post-op Vital Signs: Reviewed and stable  Complications: No apparent anesthesia complications

## 2011-12-09 NOTE — Anesthesia Preprocedure Evaluation (Addendum)
Anesthesia Evaluation  Patient identified by MRN, date of birth, ID band Patient awake    Reviewed: Allergy & Precautions, H&P , NPO status , Patient's Chart, lab work & pertinent test results  Airway Mallampati: III TM Distance: >3 FB Neck ROM: Full    Dental No notable dental hx. (+) Teeth Intact   Pulmonary neg pulmonary ROS,  breath sounds clear to auscultation  Pulmonary exam normal       Cardiovascular hypertension, Pt. on medications Rhythm:Regular Rate:Normal  Patent Foramen Ovale   Neuro/Psych  Headaches, Depression    GI/Hepatic negative GI ROS, Neg liver ROS,   Endo/Other  negative endocrine ROS  Renal/GU negative Renal ROS  negative genitourinary   Musculoskeletal negative musculoskeletal ROS (+)   Abdominal   Peds  Hematology negative hematology ROS (+)   Anesthesia Other Findings   Reproductive/Obstetrics negative OB ROS Desires Sterilization                           Anesthesia Physical Anesthesia Plan  ASA: II  Anesthesia Plan: General   Post-op Pain Management:    Induction: Intravenous  Airway Management Planned: Oral ETT  Additional Equipment:   Intra-op Plan:   Post-operative Plan: Extubation in OR  Informed Consent: I have reviewed the patients History and Physical, chart, labs and discussed the procedure including the risks, benefits and alternatives for the proposed anesthesia with the patient or authorized representative who has indicated his/her understanding and acceptance.   Dental advisory given  Plan Discussed with: Anesthesiologist, CRNA and Surgeon  Anesthesia Plan Comments:        Anesthesia Quick Evaluation

## 2011-12-12 ENCOUNTER — Encounter (HOSPITAL_COMMUNITY): Payer: Self-pay | Admitting: Obstetrics & Gynecology

## 2011-12-29 ENCOUNTER — Encounter (HOSPITAL_COMMUNITY): Payer: Self-pay

## 2011-12-29 ENCOUNTER — Emergency Department (HOSPITAL_COMMUNITY): Payer: BC Managed Care – PPO

## 2011-12-29 ENCOUNTER — Emergency Department (HOSPITAL_COMMUNITY)
Admission: EM | Admit: 2011-12-29 | Discharge: 2011-12-29 | Disposition: A | Discharge status: Home / Self Care | Payer: BC Managed Care – PPO | Attending: Emergency Medicine | Admitting: Emergency Medicine

## 2011-12-29 DIAGNOSIS — R208 Other disturbances of skin sensation: Secondary | ICD-10-CM

## 2011-12-29 DIAGNOSIS — I10 Essential (primary) hypertension: Secondary | ICD-10-CM | POA: Insufficient documentation

## 2011-12-29 DIAGNOSIS — F3289 Other specified depressive episodes: Secondary | ICD-10-CM | POA: Insufficient documentation

## 2011-12-29 DIAGNOSIS — Z87891 Personal history of nicotine dependence: Secondary | ICD-10-CM | POA: Insufficient documentation

## 2011-12-29 DIAGNOSIS — E0590 Thyrotoxicosis, unspecified without thyrotoxic crisis or storm: Secondary | ICD-10-CM | POA: Insufficient documentation

## 2011-12-29 DIAGNOSIS — R209 Unspecified disturbances of skin sensation: Secondary | ICD-10-CM | POA: Insufficient documentation

## 2011-12-29 DIAGNOSIS — F329 Major depressive disorder, single episode, unspecified: Secondary | ICD-10-CM | POA: Insufficient documentation

## 2011-12-29 DIAGNOSIS — Z79899 Other long term (current) drug therapy: Secondary | ICD-10-CM | POA: Insufficient documentation

## 2011-12-29 LAB — CBC WITH DIFFERENTIAL/PLATELET
Basophils Relative: 0 % (ref 0–1)
Eosinophils Absolute: 0.1 10*3/uL (ref 0.0–0.7)
HCT: 37.1 % (ref 36.0–46.0)
Hemoglobin: 13.1 g/dL (ref 12.0–15.0)
Lymphs Abs: 2.9 10*3/uL (ref 0.7–4.0)
MCH: 29.1 pg (ref 26.0–34.0)
MCHC: 35.3 g/dL (ref 30.0–36.0)
MCV: 82.4 fL (ref 78.0–100.0)
Monocytes Absolute: 0.5 10*3/uL (ref 0.1–1.0)
Monocytes Relative: 6 % (ref 3–12)
RBC: 4.5 MIL/uL (ref 3.87–5.11)

## 2011-12-29 LAB — URINALYSIS, ROUTINE W REFLEX MICROSCOPIC
Bilirubin Urine: NEGATIVE
Glucose, UA: NEGATIVE mg/dL
Protein, ur: NEGATIVE mg/dL
Specific Gravity, Urine: 1.026 (ref 1.005–1.030)
Urobilinogen, UA: 0.2 mg/dL (ref 0.0–1.0)

## 2011-12-29 LAB — URINE MICROSCOPIC-ADD ON

## 2011-12-29 LAB — BASIC METABOLIC PANEL
BUN: 7 mg/dL (ref 6–23)
CO2: 20 mEq/L (ref 19–32)
GFR calc non Af Amer: >90 mL/min (ref >90)
Glucose, Bld: 103 mg/dL — ABNORMAL HIGH (ref 70–99)
Potassium: 3.4 mEq/L — ABNORMAL LOW (ref 3.5–5.1)
Sodium: 138 mEq/L (ref 135–145)

## 2011-12-29 NOTE — ED Provider Notes (Signed)
History     CSN: [id number]  Arrival date & time 12/29/11  1405   First MD Initiated Contact with Patient 12/29/11 1611      Chief Complaint  Patient presents with  . Numbness    (Consider location/radiation/quality/duration/timing/severity/associated sxs/prior treatment) HPI The patient presented with L arm tingling and heaviness since 7AM this morning.  The patient reports onset of tingling after getting out of the shower, denies trauma to the area.  She reports the dysesthesia discomfort has improved and complains of L arm heaviness. She was referred to the ED from her PCP office today due to possibility of a CVA/TIA per the patient.  She denies LOC, lightheadedness, facial drooping, change in speech, chest pain, vomiting, lower extremity dysesthesia or weakness.   Denies fever chills, headache, nausea, or vomiting.   Past Medical History  Diagnosis Date  . Hypertension   . Thyroid disease     hyperthyroid  . PFO (patent foramen ovale)   . Depression   . Headache   . Sterilization 12/09/2011  . Difficult intubation 12/09/2011    Past Surgical History  Procedure Date  . Wisdom tooth extraction 2010  . Dilation and evacuation 2009  . Cholecystectomy open 2011  . Cryotherapy   . Dilitation & currettage/hystroscopy with essure 12/09/2011    Procedure: DILATATION & CURETTAGE/HYSTEROSCOPY WITH ESSURE;  Surgeon: Robley Fries, MD;  Location: [facility];  Service: Gynecology;  Laterality: N/A;    No family history on file.  History  Substance Use Topics  . Smoking status: Former Smoker -- 0.1 packs/day for 2 years    Types: Cigarettes    Quit date: 01/11/2004  . Smokeless tobacco: Never Used  . Alcohol Use: Yes     Comment: occasionally    OB History    Grav Para Term Preterm Abortions TAB SAB Ect Mult Living   3 2 1 1 1  0 1 0 0 2      Review of Systems All other systems negative except as documented in the HPI. All pertinent positives and negatives as reviewed  in the HPI.  Allergies  Review of patient's allergies indicates no known allergies.  Home Medications   Current Outpatient Rx  Name  Route  Sig  Dispense  Refill  . ALPRAZOLAM 0.5 MG PO TABS   Oral   Take 0.5 mg by mouth 2 (two) times daily.         Marland Kitchen NUVARING [facility]   Vaginal   Place 1 application vaginally every 30 (thirty) days.         Marland Kitchen LOSARTAN POTASSIUM 25 MG PO TABS   Oral   Take 25 mg by mouth daily.         Marland Kitchen METFORMIN HCL 500 MG PO TABS   Oral   Take 250 mg by mouth 2 (two) times daily with a meal.         . NAPROXEN SODIUM 220 MG PO TABS   Oral   Take 440 mg by mouth every 12 (twelve) hours as needed. For pain         . SERTRALINE HCL 50 MG PO TABS   Oral   Take 50 mg by mouth daily.           BP 125/82  Pulse 106  Temp 98 F (36.7 C) (Oral)  Resp 18  SpO2 99%  Physical Exam  Nursing note and vitals reviewed. Constitutional: She is oriented to person, place, and time. She appears well-developed  and well-nourished. She is cooperative. No distress.  HENT:  Head: Normocephalic and atraumatic.  Mouth/Throat: Oropharynx is clear and moist.  Eyes: Pupils are equal, round, and reactive to light.  Neck: Normal range of motion. Neck supple. Muscular tenderness present. No rigidity.    Cardiovascular: Normal rate, regular rhythm, S1 normal, S2 normal and normal heart sounds.   Pulmonary/Chest: Effort normal and breath sounds normal. No respiratory distress. She has no decreased breath sounds. She has no wheezes. She has no rhonchi. She has no rales. She exhibits tenderness.    Musculoskeletal:       Left shoulder: She exhibits pain. She exhibits no deformity, no spasm, normal pulse and normal strength.  Neurological: She is alert and oriented to person, place, and time. She has normal strength. She displays no atrophy. No sensory deficit. She exhibits normal muscle tone. Coordination and gait normal. GCS eye subscore is 4. GCS verbal subscore is 5.  GCS motor subscore is 6.  Reflex Scores:      Tricep reflexes are 2+ on the right side and 2+ on the left side.      Bicep reflexes are 2+ on the right side and 2+ on the left side. Skin: Skin is warm and dry. No rash noted. No cyanosis. Nails show no clubbing.  Psychiatric: She has a normal mood and affect.    ED Course  Procedures (including critical care time)  Labs Reviewed  URINALYSIS, ROUTINE W REFLEX MICROSCOPIC - Abnormal; Notable for the following:    Hgb urine dipstick TRACE (*)     All other components within normal limits  BASIC METABOLIC PANEL - Abnormal; Notable for the following:    Potassium 3.4 (*)     Glucose, Bld 103 (*)     All other components within normal limits  URINE MICROSCOPIC-ADD ON - Abnormal; Notable for the following:    Bacteria, UA FEW (*)     All other components within normal limits  CBC WITH DIFFERENTIAL  POCT PREGNANCY, URINE   Ct Head Wo Contrast  12/29/2011  *RADIOLOGY REPORT*  Clinical Data:  Altered mental status and numbness of the left hand and forearm.  CT HEAD WITHOUT CONTRAST CT CERVICAL SPINE WITHOUT CONTRAST  Technique:  Multidetector CT imaging of the head and cervical spine was performed following the standard protocol without intravenous contrast.  Multiplanar CT image reconstructions of the cervical spine were also generated.  Comparison:  CT cervical spine 08/11/2005  CT HEAD  Findings: No acute intracranial abnormality is identified. Specifically, there is no hemorrhage, hydrocephalus, mass effect, mass lesion, or evidence of acute cortically based infarction. There is some mucosal thickening and possibly some fluid within the left sphenoid sinus.  The remaining sinuses are clear.  The mastoid air cells and middle ears are clear.  The skull is intact.  Soft tissues of the scalp and orbits are symmetric.  IMPRESSION:  1.  No acute intracranial abnormality. 2.  Left sphenoid sinus disease.  CT CERVICAL SPINE  Findings: Cervical spine is  normally aligned from the skull base through the T1 vertebral body. There is loss of the normal cervical lordosis.  Vertebral bodies are normal in height.  There is no significant disc space narrowing.  Cervical spine vertebral bodies are intact.  No significant neural foraminal narrowing.  The spinal canal is patent.  Prevertebral soft tissue contour is normal.  Thyroid gland normal.  IMPRESSION:  1.  No evidence of acute bony trauma to the cervical spine. 2.  Loss of the normal cervical lordosis.  This can be seen in the setting of muscle spasm, or could be due to patient positioning. 3.  Negative for significant degenerative change.   Original Report Authenticated By: Britta Mccreedy, M.D.    Ct Cervical Spine Wo Contrast  12/29/2011  *RADIOLOGY REPORT*  Clinical Data:  Altered mental status and numbness of the left hand and forearm.  CT HEAD WITHOUT CONTRAST CT CERVICAL SPINE WITHOUT CONTRAST  Technique:  Multidetector CT imaging of the head and cervical spine was performed following the standard protocol without intravenous contrast.  Multiplanar CT image reconstructions of the cervical spine were also generated.  Comparison:  CT cervical spine 08/11/2005  CT HEAD  Findings: No acute intracranial abnormality is identified. Specifically, there is no hemorrhage, hydrocephalus, mass effect, mass lesion, or evidence of acute cortically based infarction. There is some mucosal thickening and possibly some fluid within the left sphenoid sinus.  The remaining sinuses are clear.  The mastoid air cells and middle ears are clear.  The skull is intact.  Soft tissues of the scalp and orbits are symmetric.  IMPRESSION:  1.  No acute intracranial abnormality. 2.  Left sphenoid sinus disease.  CT CERVICAL SPINE  Findings: Cervical spine is normally aligned from the skull base through the T1 vertebral body. There is loss of the normal cervical lordosis.  Vertebral bodies are normal in height.  There is no significant disc  space narrowing.  Cervical spine vertebral bodies are intact.  No significant neural foraminal narrowing.  The spinal canal is patent.  Prevertebral soft tissue contour is normal.  Thyroid gland normal.  IMPRESSION:  1.  No evidence of acute bony trauma to the cervical spine. 2.  Loss of the normal cervical lordosis.  This can be seen in the setting of muscle spasm, or could be due to patient positioning. 3.  Negative for significant degenerative change.   Original Report Authenticated By: Britta Mccreedy, M.D.       [phone] Discussed tests results with patient and need to follow up with neurologist.    The patient no neurological deficits and there is a specific forearm distribution to her tingling. The patient is advised to return here as needed. Follow up with neurology.   MDM  MDM Reviewed: nursing note and vitals Interpretation: labs and CT scan            Carlyle Dolly, PA-C 12/29/11 1823  Carlyle Dolly, PA-C 12/29/11 1824

## 2011-12-29 NOTE — ED Provider Notes (Signed)
History/physical exam/procedure(s) were performed by non-physician practitioner and as supervising physician I was immediately available for consultation/collaboration. I have reviewed all notes and am in agreement with care and plan.   Mariaclara Spear S Tammy Wickliffe, MD 12/29/11 2023 

## 2011-12-29 NOTE — ED Provider Notes (Signed)
MSE was initiated and I personally evaluated the patient and placed orders (if any) at  2:31 PM on December 29, 2011.  Young F NAD now w L arm dysesthesia (Hx of HTN, but otherwise generally well)  The patient appears stable so that the remainder of the MSE may be completed by another provider.  Date: 12/29/2011  Rate: 106  Rhythm: sinus tachycardia  QRS Axis: normal  Intervals: normal  ST/T Wave abnormalities: normal  Conduction Disutrbances:none  Narrative Interpretation:   Old EKG Reviewed: none available ABNORMAL   Gerhard Munch, MD 12/29/11 1432

## 2011-12-29 NOTE — ED Notes (Signed)
Patient's sister came out to nurses desk to ask about patient.  We went into the room with patient.  Sister advised "I am ready to go, she has been here a long time".  I advised that we generally have results back from CT scan within 45 min to an hour of scan.   Sister very upset.  Patient was fine with answer.

## 2011-12-29 NOTE — ED Notes (Signed)
Pt presents with onset of numbness to L hand and forearm that she first noted after she got out of shower this morning at 0700.  Pt reports pain to L thigh that is intermittent and L sided rib pain that began x 2 days ago.  Pt seen at PCP and was found to have L hand weakness - referred here.

## 2012-01-13 ENCOUNTER — Emergency Department (HOSPITAL_COMMUNITY): Payer: BC Managed Care – PPO

## 2012-01-13 ENCOUNTER — Encounter (HOSPITAL_COMMUNITY): Payer: Self-pay | Admitting: Emergency Medicine

## 2012-01-13 ENCOUNTER — Emergency Department (HOSPITAL_COMMUNITY)
Admission: EM | Admit: 2012-01-13 | Discharge: 2012-01-14 | Disposition: A | Discharge status: Home / Self Care | Payer: BC Managed Care – PPO | Attending: Emergency Medicine | Admitting: Emergency Medicine

## 2012-01-13 DIAGNOSIS — J111 Influenza due to unidentified influenza virus with other respiratory manifestations: Secondary | ICD-10-CM | POA: Insufficient documentation

## 2012-01-13 DIAGNOSIS — Z87891 Personal history of nicotine dependence: Secondary | ICD-10-CM | POA: Insufficient documentation

## 2012-01-13 DIAGNOSIS — Z3202 Encounter for pregnancy test, result negative: Secondary | ICD-10-CM | POA: Insufficient documentation

## 2012-01-13 DIAGNOSIS — R197 Diarrhea, unspecified: Secondary | ICD-10-CM | POA: Insufficient documentation

## 2012-01-13 DIAGNOSIS — F329 Major depressive disorder, single episode, unspecified: Secondary | ICD-10-CM | POA: Insufficient documentation

## 2012-01-13 DIAGNOSIS — R1110 Vomiting, unspecified: Secondary | ICD-10-CM

## 2012-01-13 DIAGNOSIS — F3289 Other specified depressive episodes: Secondary | ICD-10-CM | POA: Insufficient documentation

## 2012-01-13 DIAGNOSIS — I10 Essential (primary) hypertension: Secondary | ICD-10-CM | POA: Insufficient documentation

## 2012-01-13 DIAGNOSIS — E039 Hypothyroidism, unspecified: Secondary | ICD-10-CM | POA: Insufficient documentation

## 2012-01-13 DIAGNOSIS — E860 Dehydration: Secondary | ICD-10-CM

## 2012-01-13 DIAGNOSIS — Z79899 Other long term (current) drug therapy: Secondary | ICD-10-CM | POA: Insufficient documentation

## 2012-01-13 LAB — URINALYSIS, MICROSCOPIC ONLY
Bilirubin Urine: NEGATIVE
Nitrite: NEGATIVE
Specific Gravity, Urine: 1.022 (ref 1.005–1.030)
Urobilinogen, UA: 0.2 mg/dL (ref 0.0–1.0)
pH: 6 (ref 5.0–8.0)

## 2012-01-13 LAB — CBC WITH DIFFERENTIAL/PLATELET
Basophils Absolute: 0 10*3/uL (ref 0.0–0.1)
Eosinophils Absolute: 0.1 10*3/uL (ref 0.0–0.7)
Eosinophils Relative: 1 % (ref 0–5)
HCT: 36.7 % (ref 36.0–46.0)
MCH: 28.2 pg (ref 26.0–34.0)
MCHC: 34.6 g/dL (ref 30.0–36.0)
MCV: 81.6 fL (ref 78.0–100.0)
Monocytes Absolute: 0.9 10*3/uL (ref 0.1–1.0)
Platelets: 252 10*3/uL (ref 150–400)
RDW: 12.4 % (ref 11.5–15.5)
WBC: 10.3 10*3/uL (ref 4.0–10.5)

## 2012-01-13 LAB — COMPREHENSIVE METABOLIC PANEL
ALT: 18 U/L (ref 0–35)
AST: 15 U/L (ref 0–37)
Calcium: 9.3 mg/dL (ref 8.4–10.5)
Creatinine, Ser: 0.54 mg/dL (ref 0.50–1.10)
GFR calc Af Amer: >90 mL/min (ref >90)
GFR calc non Af Amer: >90 mL/min (ref >90)
Sodium: 137 mEq/L (ref 135–145)
Total Protein: 7.6 g/dL (ref 6.0–8.3)

## 2012-01-13 LAB — POCT PREGNANCY, URINE: Preg Test, Ur: NEGATIVE

## 2012-01-13 MED ORDER — SODIUM CHLORIDE 0.9 % IV SOLN
1000.0000 mL | Freq: Once | INTRAVENOUS | Status: AC
  Administered 2012-01-13: 1000 mL via INTRAVENOUS

## 2012-01-13 MED ORDER — SODIUM CHLORIDE 0.9 % IV BOLUS (SEPSIS): 1000.0000 mL | Freq: Once | INTRAVENOUS | Status: DC

## 2012-01-13 MED ORDER — ONDANSETRON HCL 4 MG/2ML IJ SOLN
4.0000 mg | Freq: Once | INTRAMUSCULAR | Status: AC
  Administered 2012-01-13: 4 mg via INTRAVENOUS
  Filled 2012-01-13: qty 2

## 2012-01-13 MED ORDER — SODIUM CHLORIDE 0.9 % IV SOLN: 1000.0000 mL | INTRAVENOUS | Status: DC

## 2012-01-13 MED ORDER — BENZONATATE 100 MG PO CAPS: 100.0000 mg | ORAL_CAPSULE | Freq: Three times a day (TID) | ORAL | Status: DC

## 2012-01-13 MED ORDER — DIPHENHYDRAMINE HCL 50 MG/ML IJ SOLN: 25.0000 mg | Freq: Once | INTRAMUSCULAR | Status: DC

## 2012-01-13 MED ORDER — METOCLOPRAMIDE HCL 5 MG/ML IJ SOLN: 10.0000 mg | Freq: Once | INTRAMUSCULAR | Status: DC

## 2012-01-13 MED ORDER — ONDANSETRON 8 MG PO TBDP: 8.0000 mg | ORAL_TABLET | Freq: Three times a day (TID) | ORAL | Status: DC | PRN

## 2012-01-13 MED ORDER — AZITHROMYCIN 250 MG PO TABS: ORAL_TABLET | ORAL | Status: DC

## 2012-01-13 NOTE — ED Notes (Signed)
Spiked bag for RN and placed in room

## 2012-01-13 NOTE — ED Notes (Signed)
Pt states she was dx with flu 3 days ago, started Tamiflu. Dx with ear infection yesterday. Began vomiting today, emesis x 8 today, denies diarrhea, denies fever. Pt c/o HA and dizziness at this time. PWD.

## 2012-01-13 NOTE — ED Provider Notes (Signed)
History     CSN: [id number]  Arrival date & time 01/13/12  4098   First MD Initiated Contact with Patient 01/13/12 1955      Chief Complaint  Patient presents with  . Influenza    (Consider location/radiation/quality/duration/timing/severity/associated sxs/prior treatment) HPI  Patient reports 5 days ago she started having body aches. The following day she started having fever and states her highest fever for the week was 103.6 which was 3 days ago. She's been having cough and has had some mild sputum production that is green. She also has rhinorrhea that is green. She denies any sore throat. She states today she has started having vomiting and has vomited at least 8 times without diarrhea. She states she has a dull epigastric abdominal pain that hurts when she touches it. She states she's had a decreased appetite all week and today she feels dizzy and weak. She states her sister was ill when she saw her over the holidays. Patient states she did have a flu shot this year. She was seen by her physicians 3 days ago and did have a positive flu test and has been on Tamiflu without feeling better.  Patient reports she has been on metformin for 3 months because she has a high insulin level and hopefully this will help her lose weight.  PCP Dr Carlota Raspberry GYN Dr Mitzi Hansen  Past Medical History  Diagnosis Date  . Hypertension   . Thyroid disease     hyperthyroid  . PFO (patent foramen ovale)   . Depression   . Headache   . Sterilization 12/09/2011  . Difficult intubation 12/09/2011    Past Surgical History  Procedure Date  . Wisdom tooth extraction 2010  . Dilation and evacuation 2009  . Cholecystectomy open 2011  . Cryotherapy   . Dilitation & currettage/hystroscopy with essure 12/09/2011    Procedure: DILATATION & CURETTAGE/HYSTEROSCOPY WITH ESSURE;  Surgeon: Robley Fries, MD;  Location: [facility];  Service: Gynecology;  Laterality: N/A;    No family history on file.  History    Substance Use Topics  . Smoking status: Former Smoker -- 0.1 packs/day for 2 years    Types: Cigarettes    Quit date: 01/11/2004  . Smokeless tobacco: Never Used  . Alcohol Use: Yes     Comment: occasionally  Lives at home Lives with spouse employed  OB History    Grav Para Term Preterm Abortions TAB SAB Ect Mult Living   3 2 1 1 1  0 1 0 0 2      Review of Systems  All other systems reviewed and are negative.    Allergies  Review of patient's allergies indicates no known allergies.  Home Medications   Current Outpatient Rx  Name  Route  Sig  Dispense  Refill  . ALPRAZOLAM 0.5 MG PO TABS   Oral   Take 0.5 mg by mouth 2 (two) times daily.         . AMOXICILLIN 250 MG PO CAPS   Oral   Take 1,000 mg by mouth 2 (two) times daily.         Marland Kitchen BENZO-PHENYLEPH-ANTIPYRINE OT SOLN   Otic   Place 1 drop in ear(s) 4 (four) times daily as needed. Pain         . NUVARING VA   Vaginal   Place 1 application vaginally every 30 (thirty) days.         . IBUPROFEN 200 MG PO TABS   Oral  Take 200 mg by mouth every 6 (six) hours as needed. Pain         . LOSARTAN POTASSIUM 25 MG PO TABS   Oral   Take 25 mg by mouth daily.         Marland Kitchen METFORMIN HCL 500 MG PO TABS   Oral   Take 250 mg by mouth 2 (two) times daily with a meal.         . NAPROXEN SODIUM 220 MG PO TABS   Oral   Take 440 mg by mouth every 12 (twelve) hours as needed. For pain         . SERTRALINE HCL 50 MG PO TABS   Oral   Take 50 mg by mouth daily.           BP 149/86  Pulse 100  Temp 98.9 F (37.2 C) (Oral)  Resp 20  Ht 5' (1.524 m)  Wt 172 lb (78.019 kg)  BMI 33.59 kg/m2  SpO2 100%  Vital signs normal except borderline tachycardia   Physical Exam  Nursing note and vitals reviewed. Constitutional: She is oriented to person, place, and time. She appears well-developed and well-nourished.  Non-toxic appearance. She does not appear ill. No distress.  HENT:  Head: Normocephalic  and atraumatic.  Right Ear: External ear normal.  Left Ear: External ear normal.  Nose: Nose normal. No mucosal edema or rhinorrhea.  Mouth/Throat: Mucous membranes are normal. No dental abscesses or uvula swelling.       Dry tongue  Eyes: Conjunctivae normal and EOM are normal. Pupils are equal, round, and reactive to light.  Neck: Normal range of motion and full passive range of motion without pain. Neck supple.  Cardiovascular: Normal rate, regular rhythm and normal heart sounds.  Exam reveals no gallop and no friction rub.   No murmur heard. Pulmonary/Chest: Effort normal and breath sounds normal. No respiratory distress. She has no wheezes. She has no rhonchi. She has no rales. She exhibits no tenderness and no crepitus.  Abdominal: Soft. Normal appearance and bowel sounds are normal. She exhibits no distension. There is no tenderness. There is no rebound and no guarding.  Musculoskeletal: Normal range of motion. She exhibits no edema and no tenderness.       Moves all extremities well.   Neurological: She is alert and oriented to person, place, and time. She has normal strength. No cranial nerve deficit.  Skin: Skin is warm, dry and intact. No rash noted. No erythema. No pallor.  Psychiatric: She has a normal mood and affect. Her speech is normal and behavior is normal. Her mood appears not anxious.    ED Course  Procedures (including critical care time)   Medications  0.9 %  sodium chloride infusion (1000 mL Intravenous New Bag/Given 01/13/12 2056)    Followed by  0.9 %  sodium chloride infusion (1000 mL Intravenous New Bag/Given 01/13/12 2324)    Followed by  0.9 %  sodium chloride infusion (not administered)  sodium chloride 0.9 % bolus 1,000 mL (not administered)  metoCLOPramide (REGLAN) injection 10 mg (not administered)  diphenhydrAMINE (BENADRYL) injection 25 mg (not administered)  benzonatate (TESSALON) 100 MG capsule (not administered)  ondansetron (ZOFRAN) injection 4 mg  (4 mg Intravenous Given 01/13/12 2056)   Pt is feeling better.   Results for orders placed during the hospital encounter of 01/13/12  GLUCOSE, CAPILLARY      Component Value Range   Glucose-Capillary 99  70 - 99 mg/dL  Comment 1 Notify RN    CBC WITH DIFFERENTIAL      Component Value Range   WBC 10.3  4.0 - 10.5 K/uL   RBC 4.50  3.87 - 5.11 MIL/uL   Hemoglobin 12.7  12.0 - 15.0 g/dL   HCT 21.3  08.6 - 57.8 %   MCV 81.6  78.0 - 100.0 fL   MCH 28.2  26.0 - 34.0 pg   MCHC 34.6  30.0 - 36.0 g/dL   RDW 46.9  62.9 - 52.8 %   Platelets 252  150 - 400 K/uL   Neutrophils Relative 62  43 - 77 %   Neutro Abs 6.4  1.7 - 7.7 K/uL   Lymphocytes Relative 30  12 - 46 %   Lymphs Abs 3.1  0.7 - 4.0 K/uL   Monocytes Relative 8  3 - 12 %   Monocytes Absolute 0.9  0.1 - 1.0 K/uL   Eosinophils Relative 1  0 - 5 %   Eosinophils Absolute 0.1  0.0 - 0.7 K/uL   Basophils Relative 0  0 - 1 %   Basophils Absolute 0.0  0.0 - 0.1 K/uL  COMPREHENSIVE METABOLIC PANEL      Component Value Range   Sodium 137  135 - 145 mEq/L   Potassium 3.7  3.5 - 5.1 mEq/L   Chloride 101  96 - 112 mEq/L   CO2 25  19 - 32 mEq/L   Glucose, Bld 98  70 - 99 mg/dL   BUN 6  6 - 23 mg/dL   Creatinine, Ser 4.13  0.50 - 1.10 mg/dL   Calcium 9.3  8.4 - 24.4 mg/dL   Total Protein 7.6  6.0 - 8.3 g/dL   Albumin 3.3 (*) 3.5 - 5.2 g/dL   AST 15  0 - 37 U/L   ALT 18  0 - 35 U/L   Alkaline Phosphatase 65  39 - 117 U/L   Total Bilirubin 0.4  0.3 - 1.2 mg/dL   GFR calc non Af Amer >90  >90 mL/min   GFR calc Af Amer >90  >90 mL/min  URINALYSIS, MICROSCOPIC ONLY      Component Value Range   Color, Urine YELLOW  YELLOW   APPearance CLEAR  CLEAR   Specific Gravity, Urine 1.022  1.005 - 1.030   pH 6.0  5.0 - 8.0   Glucose, UA NEGATIVE  NEGATIVE mg/dL   Hgb urine dipstick TRACE (*) NEGATIVE   Bilirubin Urine NEGATIVE  NEGATIVE   Ketones, ur NEGATIVE  NEGATIVE mg/dL   Protein, ur NEGATIVE  NEGATIVE mg/dL   Urobilinogen, UA 0.2  0.0 -  1.0 mg/dL   Nitrite NEGATIVE  NEGATIVE   Leukocytes, UA NEGATIVE  NEGATIVE   RBC / HPF 0-2  <3 RBC/hpf   Bacteria, UA RARE  RARE   Squamous Epithelial / LPF FEW (*) RARE   Urine-Other MUCOUS PRESENT    POCT PREGNANCY, URINE      Component Value Range   Preg Test, Ur NEGATIVE  NEGATIVE  POCT PREGNANCY, URINE      Component Value Range   Preg Test, Ur NEGATIVE  NEGATIVE   Laboratory interpretation all normal   Dg Chest 2 View  01/13/2012  *RADIOLOGY REPORT*  Clinical Data: Cough, chest congestion, fever, shortness of breath, myalgias.  CHEST - 2 VIEW  Comparison: 12/24/2010.  Findings: Normal sized heart.  Clear lungs.  Normal appearing bones.  Cholecystectomy clips.  IMPRESSION: No acute abnormality.   Original  Report Authenticated By: Beckie Salts, M.D.      1. Influenza   2. Dehydration   3. Vomiting and diarrhea    New Prescriptions   AZITHROMYCIN (ZITHROMAX Z-PAK) 250 MG TABLET    Take 2 po the first day then once a day for the next 4 days.   BENZONATATE (TESSALON) 100 MG CAPSULE    Take 1 capsule (100 mg total) by mouth every 8 (eight) hours.   ONDANSETRON (ZOFRAN ODT) 8 MG DISINTEGRATING TABLET    Take 1 tablet (8 mg total) by mouth every 8 (eight) hours as needed for nausea.   Plan discharge  Devoria Albe, MD, FACEP    MDM          Ward Givens, MD 01/13/12 [phone]

## 2012-01-13 NOTE — ED Notes (Signed)
Patient is alert and oriented x3.  She was given DC instructions and follow up visit instructions.  Patient gave verbal understanding. She was DC ambulatory under her own power to home.  V/S stable.  He was not showing any signs of distress on DC 

## 2012-03-05 ENCOUNTER — Other Ambulatory Visit (HOSPITAL_COMMUNITY): Payer: Self-pay | Admitting: Obstetrics & Gynecology

## 2012-03-05 DIAGNOSIS — N971 Female infertility of tubal origin: Secondary | ICD-10-CM

## 2012-03-08 ENCOUNTER — Ambulatory Visit (HOSPITAL_COMMUNITY)
Admission: RE | Admit: 2012-03-08 | Discharge: 2012-03-08 | Disposition: A | Discharge status: Home / Self Care | Payer: BC Managed Care – PPO | Source: Ambulatory Visit | Attending: Obstetrics & Gynecology | Admitting: Obstetrics & Gynecology

## 2012-03-08 DIAGNOSIS — N971 Female infertility of tubal origin: Secondary | ICD-10-CM

## 2012-03-08 DIAGNOSIS — Z3049 Encounter for surveillance of other contraceptives: Secondary | ICD-10-CM | POA: Insufficient documentation

## 2012-03-08 MED ORDER — IOHEXOL 300 MG/ML  SOLN
20.0000 mL | Freq: Once | INTRAMUSCULAR | Status: AC | PRN
  Administered 2012-03-08: 10 mL

## 2012-07-04 ENCOUNTER — Ambulatory Visit (INDEPENDENT_AMBULATORY_CARE_PROVIDER_SITE_OTHER): Payer: 59 | Admitting: Emergency Medicine

## 2012-07-04 VITALS — BP 126/66 | HR 80 | Temp 98.3°F | Resp 20 | Ht 61.5 in | Wt 177.8 lb

## 2012-07-04 DIAGNOSIS — R109 Unspecified abdominal pain: Secondary | ICD-10-CM

## 2012-07-04 DIAGNOSIS — G8929 Other chronic pain: Secondary | ICD-10-CM

## 2012-07-04 DIAGNOSIS — N201 Calculus of ureter: Secondary | ICD-10-CM

## 2012-07-04 LAB — POCT UA - MICROSCOPIC ONLY
Bacteria, U Microscopic: NEGATIVE
Casts, Ur, LPF, POC: NEGATIVE
Crystals, Ur, HPF, POC: NEGATIVE
Mucus, UA: NEGATIVE
Yeast, UA: NEGATIVE

## 2012-07-04 LAB — POCT URINALYSIS DIPSTICK
Nitrite, UA: NEGATIVE
Protein, UA: NEGATIVE
Spec Grav, UA: 1.01
Urobilinogen, UA: 0.2
pH, UA: 7

## 2012-07-04 MED ORDER — OXYCODONE-ACETAMINOPHEN 5-325 MG PO TABS: 1.0000 | ORAL_TABLET | ORAL | Status: DC | PRN

## 2012-07-04 NOTE — Patient Instructions (Addendum)

## 2012-07-04 NOTE — Progress Notes (Signed)
[facility] [address], [place] [address] [phone]  Date:  07/04/2012   Name:  Kristen Klein   DOB:  Jan 26, 1985   MRN:  [id number]  PCP:  Gaye Alken, MD    Chief Complaint: Flank Pain and Hematuria   History of Present Illness:  Kristen Klein is a 27 y.o. very pleasant female patient who presents with the following:  Having abdominal pain since last week.  Saw her gyn on Friday and an ultrasound was significant for an ovarian cyst.  She was put on septra for a presumed UTI as she had RBC's in her UA.  She has since developed left flank pain radiating to her left inner thigh, associated with nausea.  No vomiting.  No fever or chills. No stool change, no GYN symptoms.  Pain is colicy in nature.  Has no history of kidney stones.  No improvement with over the counter medications or other home remedies. Denies other complaint or health concern today.   Patient Active Problem List   Diagnosis Date Noted  . Sterilization 12/09/2011  . Difficult intubation 12/09/2011    Past Medical History  Diagnosis Date  . Hypertension   . Thyroid disease     hyperthyroid  . PFO (patent foramen ovale)   . Depression   . Headache(784.0)   . Sterilization 12/09/2011  . Difficult intubation 12/09/2011  . Anxiety     Past Surgical History  Procedure Laterality Date  . Wisdom tooth extraction  2010  . Dilation and evacuation  2009  . Cholecystectomy open  2011  . Cryotherapy    . Dilitation & currettage/hystroscopy with essure  12/09/2011    Procedure: DILATATION & CURETTAGE/HYSTEROSCOPY WITH ESSURE;  Surgeon: Robley Fries, MD;  Location: [facility];  Service: Gynecology;  Laterality: N/A;    History  Substance Use Topics  . Smoking status: Former Smoker -- 0.15 packs/day for 2 years    Types: Cigarettes    Quit date: 01/11/2004  . Smokeless tobacco: Never Used  . Alcohol Use: 0.0 oz/week    3-5 Glasses of wine per week     Comment:  occasionally    Family History  Problem Relation Age of Onset  . Hypertension Mother   . Hypothyroidism Mother   . Hypertension Father   . Alcohol abuse Sister   . Asthma Son   . Allergies Son   . Diabetes Paternal Grandmother     No Known Allergies  Medication list has been reviewed and updated.  Current Outpatient Prescriptions on File Prior to Visit  Medication Sig Dispense Refill  . ALPRAZolam (XANAX) 0.5 MG tablet Take 0.5 mg by mouth 2 (two) times daily.      Marland Kitchen ibuprofen (ADVIL,MOTRIN) 200 MG tablet Take 200 mg by mouth every 6 (six) hours as needed. Pain      . losartan (COZAAR) 25 MG tablet Take 25 mg by mouth daily.      . metFORMIN (GLUCOPHAGE) 500 MG tablet Take 250 mg by mouth 2 (two) times daily with a meal.      . amoxicillin (AMOXIL) 250 MG capsule Take 1,000 mg by mouth 2 (two) times daily.      Marland Kitchen azithromycin (ZITHROMAX Z-PAK) 250 MG tablet Take 2 po the first day then once a day for the next 4 days.  6 tablet  0  . benzocaine-phenylephrine-antipyrine (OTOGESIC) SOLN Place 1 drop in ear(s) 4 (four) times daily as needed. Pain      .  benzonatate (TESSALON) 100 MG capsule Take 1 capsule (100 mg total) by mouth every 8 (eight) hours.  21 capsule  0  . Etonogestrel-Ethinyl Estradiol (NUVARING VA) Place 1 application vaginally every 30 (thirty) days.      . naproxen sodium (ANAPROX) 220 MG tablet Take 440 mg by mouth every 12 (twelve) hours as needed. For pain      . ondansetron (ZOFRAN ODT) 8 MG disintegrating tablet Take 1 tablet (8 mg total) by mouth every 8 (eight) hours as needed for nausea.  12 tablet  0  . sertraline (ZOLOFT) 50 MG tablet Take 50 mg by mouth daily.       No current facility-administered medications on file prior to visit.    Review of Systems:  As per HPI, otherwise negative.    Physical Examination: Filed Vitals:   07/04/12 1808  BP: 126/66  Pulse: 80  Temp: 98.3 F (36.8 C)  Resp: 20   Filed Vitals:   07/04/12 1808  Height: 5'  1.5" (1.562 m)  Weight: 177 lb 12.8 oz (80.65 kg)   Body mass index is 33.06 kg/(m^2). Ideal Body Weight: Weight in (lb) to have BMI = 25: 134.2  GEN: WDWN, NAD, Non-toxic, A & O x 3 HEENT: Atraumatic, Normocephalic. Neck supple. No masses, No LAD. Ears and Nose: No external deformity. CV: RRR, No M/G/R. No JVD. No thrill. No extra heart sounds. PULM: CTA B, no wheezes, crackles, rhonchi. No retractions. No resp. distress. No accessory muscle use. ABD: S,  ND, +BS. No rebound. No HSM.  Left CVA tender, left abdominal tenderness EXTR: No c/c/e NEURO Normal gait.  PSYCH: Normally interactive. Conversant. Not depressed or anxious appearing.  Calm demeanor.    Assessment and Plan: Ureterolithiasis Percocet Follow up as needed   Signed,  Phillips Odor, MD  Results for orders placed in visit on 07/04/12  POCT UA - MICROSCOPIC ONLY      Result Value Range   WBC, Ur, HPF, POC neg     RBC, urine, microscopic neg     Bacteria, U Microscopic neg     Mucus, UA neg     Epithelial cells, urine per micros 0-1     Crystals, Ur, HPF, POC neg     Casts, Ur, LPF, POC neg     Yeast, UA neg    POCT URINALYSIS DIPSTICK      Result Value Range   Color, UA light yellow     Clarity, UA clear     Glucose, UA neg     Bilirubin, UA neg     Ketones, UA neg     Spec Grav, UA 1.010     Blood, UA small     pH, UA 7.0     Protein, UA neg     Urobilinogen, UA 0.2     Nitrite, UA neg     Leukocytes, UA Negative

## 2012-07-08 ENCOUNTER — Emergency Department (HOSPITAL_COMMUNITY): Payer: 59

## 2012-07-08 ENCOUNTER — Encounter (HOSPITAL_COMMUNITY): Payer: Self-pay | Admitting: *Deleted

## 2012-07-08 ENCOUNTER — Emergency Department (HOSPITAL_COMMUNITY)
Admission: EM | Admit: 2012-07-08 | Discharge: 2012-07-09 | Disposition: A | Discharge status: Home / Self Care | Payer: 59 | Attending: Emergency Medicine | Admitting: Emergency Medicine

## 2012-07-08 DIAGNOSIS — Z9851 Tubal ligation status: Secondary | ICD-10-CM | POA: Insufficient documentation

## 2012-07-08 DIAGNOSIS — F411 Generalized anxiety disorder: Secondary | ICD-10-CM | POA: Insufficient documentation

## 2012-07-08 DIAGNOSIS — R110 Nausea: Secondary | ICD-10-CM | POA: Insufficient documentation

## 2012-07-08 DIAGNOSIS — E871 Hypo-osmolality and hyponatremia: Secondary | ICD-10-CM

## 2012-07-08 DIAGNOSIS — I10 Essential (primary) hypertension: Secondary | ICD-10-CM | POA: Insufficient documentation

## 2012-07-08 DIAGNOSIS — Z79899 Other long term (current) drug therapy: Secondary | ICD-10-CM | POA: Insufficient documentation

## 2012-07-08 DIAGNOSIS — R109 Unspecified abdominal pain: Secondary | ICD-10-CM | POA: Insufficient documentation

## 2012-07-08 DIAGNOSIS — F3289 Other specified depressive episodes: Secondary | ICD-10-CM | POA: Insufficient documentation

## 2012-07-08 DIAGNOSIS — Z87891 Personal history of nicotine dependence: Secondary | ICD-10-CM | POA: Insufficient documentation

## 2012-07-08 DIAGNOSIS — R7402 Elevation of levels of lactic acid dehydrogenase (LDH): Secondary | ICD-10-CM | POA: Insufficient documentation

## 2012-07-08 DIAGNOSIS — Z8679 Personal history of other diseases of the circulatory system: Secondary | ICD-10-CM | POA: Insufficient documentation

## 2012-07-08 DIAGNOSIS — R7401 Elevation of levels of liver transaminase levels: Secondary | ICD-10-CM | POA: Insufficient documentation

## 2012-07-08 DIAGNOSIS — Z8639 Personal history of other endocrine, nutritional and metabolic disease: Secondary | ICD-10-CM | POA: Insufficient documentation

## 2012-07-08 DIAGNOSIS — F329 Major depressive disorder, single episode, unspecified: Secondary | ICD-10-CM | POA: Insufficient documentation

## 2012-07-08 DIAGNOSIS — Z3202 Encounter for pregnancy test, result negative: Secondary | ICD-10-CM | POA: Insufficient documentation

## 2012-07-08 DIAGNOSIS — E119 Type 2 diabetes mellitus without complications: Secondary | ICD-10-CM | POA: Insufficient documentation

## 2012-07-08 DIAGNOSIS — R319 Hematuria, unspecified: Secondary | ICD-10-CM | POA: Insufficient documentation

## 2012-07-08 DIAGNOSIS — Z862 Personal history of diseases of the blood and blood-forming organs and certain disorders involving the immune mechanism: Secondary | ICD-10-CM | POA: Insufficient documentation

## 2012-07-08 LAB — COMPREHENSIVE METABOLIC PANEL
ALT: 75 U/L — ABNORMAL HIGH (ref 0–35)
BUN: 11 mg/dL (ref 6–23)
CO2: 25 mEq/L (ref 19–32)
Calcium: 9.2 mg/dL (ref 8.4–10.5)
Creatinine, Ser: 0.54 mg/dL (ref 0.50–1.10)
GFR calc Af Amer: >90 mL/min (ref >90)
GFR calc non Af Amer: >90 mL/min (ref >90)
Glucose, Bld: 102 mg/dL — ABNORMAL HIGH (ref 70–99)
Sodium: 134 mEq/L — ABNORMAL LOW (ref 135–145)

## 2012-07-08 LAB — LIPASE, BLOOD: Lipase: 26 U/L (ref 11–59)

## 2012-07-08 LAB — CBC WITH DIFFERENTIAL/PLATELET
Eosinophils Absolute: 0.2 10*3/uL (ref 0.0–0.7)
Eosinophils Relative: 2 % (ref 0–5)
HCT: 34.1 % — ABNORMAL LOW (ref 36.0–46.0)
Hemoglobin: 11.8 g/dL — ABNORMAL LOW (ref 12.0–15.0)
Lymphocytes Relative: 53 % — ABNORMAL HIGH (ref 12–46)
Lymphs Abs: 3.8 10*3/uL (ref 0.7–4.0)
MCH: 28.8 pg (ref 26.0–34.0)
MCV: 83.2 fL (ref 78.0–100.0)
Monocytes Absolute: 0.6 10*3/uL (ref 0.1–1.0)
Monocytes Relative: 8 % (ref 3–12)
RBC: 4.1 MIL/uL (ref 3.87–5.11)
WBC: 7.2 10*3/uL (ref 4.0–10.5)

## 2012-07-08 LAB — URINALYSIS, ROUTINE W REFLEX MICROSCOPIC
Nitrite: NEGATIVE
Protein, ur: NEGATIVE mg/dL
Urobilinogen, UA: 0.2 mg/dL (ref 0.0–1.0)

## 2012-07-08 LAB — POCT PREGNANCY, URINE: Preg Test, Ur: NEGATIVE

## 2012-07-08 MED ORDER — ONDANSETRON HCL 4 MG/2ML IJ SOLN
4.0000 mg | Freq: Once | INTRAMUSCULAR | Status: AC
  Administered 2012-07-09: 4 mg via INTRAVENOUS
  Filled 2012-07-08: qty 2

## 2012-07-08 MED ORDER — SODIUM CHLORIDE 0.9 % IV SOLN
Freq: Once | INTRAVENOUS | Status: AC
  Administered 2012-07-08: 23:00:00 via INTRAVENOUS

## 2012-07-08 MED ORDER — ONDANSETRON 8 MG PO TBDP
8.0000 mg | ORAL_TABLET | Freq: Once | ORAL | Status: AC
  Administered 2012-07-08: 8 mg via ORAL
  Filled 2012-07-08: qty 1

## 2012-07-08 MED ORDER — HYDROMORPHONE HCL PF 1 MG/ML IJ SOLN
1.0000 mg | Freq: Once | INTRAMUSCULAR | Status: AC
  Administered 2012-07-09: 1 mg via INTRAVENOUS
  Filled 2012-07-08: qty 1

## 2012-07-08 NOTE — ED Provider Notes (Signed)
History    CSN: [id number] Arrival date & time 07/08/12  2204  First MD Initiated Contact with Patient 07/08/12 2258     Chief Complaint  Patient presents with  . Abdominal Pain    LUQ   (Consider location/radiation/quality/duration/timing/severity/associated sxs/prior Treatment) Patient is a 27 y.o. female presenting with abdominal pain. The history is provided by the patient.  Abdominal Pain Associated symptoms include abdominal pain.  She has been complaining of left upper quadrant pain since yesterday. Pain was initially intermittent but has been constant for about the last 3 hours. It is a sharp pain she rates it 10/10. She states that it is similar to pain she had with her gallbladder prior to gallbladder surgery. It is worse with certain positions such as lying on her stomach. It was better when she bends over. There is associated nausea but no vomiting. She denies fever, chills, sweats. She denies constipation or diarrhea. She denies urinary urgency, frequency, tenesmus, dysuria. She's not anything to try and help her pain. Of note, she had been having some pain in her left flank and into the left lower abdomen for about the last week and was seen at urgent care where she was diagnosed with a kidney stone.  Past Medical History  Diagnosis Date  . Hypertension   . Thyroid disease     hyperthyroid  . PFO (patent foramen ovale)   . Depression   . Headache(784.0)   . Sterilization 12/09/2011  . Difficult intubation 12/09/2011  . Anxiety   . Diabetes mellitus without complication    Past Surgical History  Procedure Laterality Date  . Wisdom tooth extraction  2010  . Dilation and evacuation  2009  . Cholecystectomy open  2011  . Cryotherapy    . Dilitation & currettage/hystroscopy with essure  12/09/2011    Procedure: DILATATION & CURETTAGE/HYSTEROSCOPY WITH ESSURE;  Surgeon: Robley Fries, MD;  Location: [facility];  Service: Gynecology;  Laterality: N/A;   Family History   Problem Relation Age of Onset  . Hypertension Mother   . Hypothyroidism Mother   . Hypertension Father   . Alcohol abuse Sister   . Asthma Son   . Allergies Son   . Diabetes Paternal Grandmother    History  Substance Use Topics  . Smoking status: Former Smoker -- 0.15 packs/day for 2 years    Types: Cigarettes    Quit date: 01/11/2004  . Smokeless tobacco: Never Used  . Alcohol Use: 0.0 oz/week    3-5 Glasses of wine per week     Comment: occasionally   OB History   Grav Para Term Preterm Abortions TAB SAB Ect Mult Living   3 2 1 1 1  0 1 0 0 2     Review of Systems  Gastrointestinal: Positive for abdominal pain.  All other systems reviewed and are negative.    Allergies  Review of patient's allergies indicates no known allergies.  Home Medications   Current Outpatient Rx  Name  Route  Sig  Dispense  Refill  . ALPRAZolam (XANAX) 0.5 MG tablet   Oral   Take 0.5 mg by mouth 2 (two) times daily.         Marland Kitchen amoxicillin (AMOXIL) 250 MG capsule   Oral   Take 1,000 mg by mouth 2 (two) times daily.         Marland Kitchen azithromycin (ZITHROMAX Z-PAK) 250 MG tablet      Take 2 po the first day then once a day  for the next 4 days.   6 tablet   0   . benzocaine-phenylephrine-antipyrine (OTOGESIC) SOLN   Otic   Place 1 drop in ear(s) 4 (four) times daily as needed. Pain         . benzonatate (TESSALON) 100 MG capsule   Oral   Take 1 capsule (100 mg total) by mouth every 8 (eight) hours.   21 capsule   0   . cholecalciferol (VITAMIN D) 1000 UNITS tablet   Oral   Take 2,000 Units by mouth daily.         . Etonogestrel-Ethinyl Estradiol (NUVARING VA)   Vaginal   Place 1 application vaginally every 30 (thirty) days.         Marland Kitchen HYDROcodone-acetaminophen (NORCO/VICODIN) 5-325 MG per tablet   Oral   Take 1 tablet by mouth every 6 (six) hours as needed for pain. 1-2 tablets every 6 hrs as needed for pain         . ibuprofen (ADVIL,MOTRIN) 200 MG tablet   Oral    Take 200 mg by mouth every 6 (six) hours as needed. Pain         . losartan (COZAAR) 25 MG tablet   Oral   Take 25 mg by mouth daily.         . metFORMIN (GLUCOPHAGE) 500 MG tablet   Oral   Take 250 mg by mouth 2 (two) times daily with a meal.         . naproxen sodium (ANAPROX) 220 MG tablet   Oral   Take 440 mg by mouth every 12 (twelve) hours as needed. For pain         . ondansetron (ZOFRAN ODT) 8 MG disintegrating tablet   Oral   Take 1 tablet (8 mg total) by mouth every 8 (eight) hours as needed for nausea.   12 tablet   0   . oxyCODONE-acetaminophen (ROXICET) 5-325 MG per tablet   Oral   Take 1 tablet by mouth every 4 (four) hours as needed for pain.   40 tablet   0   . PARoxetine (PAXIL) 40 MG tablet   Oral   Take 40 mg by mouth every morning.         . sertraline (ZOLOFT) 50 MG tablet   Oral   Take 50 mg by mouth daily.          BP 130/78  Pulse 79  Temp(Src) 98 F (36.7 C) (Oral)  Resp 18  Wt 172 lb (78.019 kg)  BMI 31.98 kg/m2  SpO2 100%  LMP 07/06/2012 Physical Exam  Nursing note and vitals reviewed.  27 year old female, resting comfortably and in no acute distress. Vital signs are normal. Oxygen saturation is 100%, which is normal. Head is normocephalic and atraumatic. PERRLA, EOMI. Oropharynx is clear. Neck is nontender and supple without adenopathy or JVD. Back is nontender and there is no CVA tenderness. Lungs are clear without rales, wheezes, or rhonchi. Chest is nontender. Heart has regular rate and rhythm without murmur. Abdomen is soft, flat, with moderate left upper quadrant and epigastric tenderness. There is no rebound or guarding to. There are no masses or hepatosplenomegaly and peristalsis is normoactive. Extremities have no cyanosis or edema, full range of motion is present. Skin is warm and dry without rash. Neurologic: Mental status is normal, cranial nerves are intact, there are no motor or sensory deficits.   ED  Course  Procedures (including critical care time) Results for  orders placed during the hospital encounter of 07/08/12  CBC WITH DIFFERENTIAL      Result Value Range   WBC 7.2  4.0 - 10.5 K/uL   RBC 4.10  3.87 - 5.11 MIL/uL   Hemoglobin 11.8 (*) 12.0 - 15.0 g/dL   HCT 40.9 (*) 81.1 - 91.4 %   MCV 83.2  78.0 - 100.0 fL   MCH 28.8  26.0 - 34.0 pg   MCHC 34.6  30.0 - 36.0 g/dL   RDW 78.2  95.6 - 21.3 %   Platelets 232  150 - 400 K/uL   Neutrophils Relative % 37 (*) 43 - 77 %   Neutro Abs 2.7  1.7 - 7.7 K/uL   Lymphocytes Relative 53 (*) 12 - 46 %   Lymphs Abs 3.8  0.7 - 4.0 K/uL   Monocytes Relative 8  3 - 12 %   Monocytes Absolute 0.6  0.1 - 1.0 K/uL   Eosinophils Relative 2  0 - 5 %   Eosinophils Absolute 0.2  0.0 - 0.7 K/uL   Basophils Relative 0  0 - 1 %   Basophils Absolute 0.0  0.0 - 0.1 K/uL  COMPREHENSIVE METABOLIC PANEL      Result Value Range   Sodium 134 (*) 135 - 145 mEq/L   Potassium 3.7  3.5 - 5.1 mEq/L   Chloride 99  96 - 112 mEq/L   CO2 25  19 - 32 mEq/L   Glucose, Bld 102 (*) 70 - 99 mg/dL   BUN 11  6 - 23 mg/dL   Creatinine, Ser 0.86  0.50 - 1.10 mg/dL   Calcium 9.2  8.4 - 57.8 mg/dL   Total Protein 7.3  6.0 - 8.3 g/dL   Albumin 3.8  3.5 - 5.2 g/dL   AST 38 (*) 0 - 37 U/L   ALT 75 (*) 0 - 35 U/L   Alkaline Phosphatase 94  39 - 117 U/L   Total Bilirubin 0.2 (*) 0.3 - 1.2 mg/dL   GFR calc non Af Amer >90  >90 mL/min   GFR calc Af Amer >90  >90 mL/min  LIPASE, BLOOD      Result Value Range   Lipase 26  11 - 59 U/L  URINALYSIS, ROUTINE W REFLEX MICROSCOPIC      Result Value Range   Color, Urine YELLOW  YELLOW   APPearance CLEAR  CLEAR   Specific Gravity, Urine 1.022  1.005 - 1.030   pH 8.0  5.0 - 8.0   Glucose, UA NEGATIVE  NEGATIVE mg/dL   Hgb urine dipstick MODERATE (*) NEGATIVE   Bilirubin Urine NEGATIVE  NEGATIVE   Ketones, ur NEGATIVE  NEGATIVE mg/dL   Protein, ur NEGATIVE  NEGATIVE mg/dL   Urobilinogen, UA 0.2  0.0 - 1.0 mg/dL   Nitrite  NEGATIVE  NEGATIVE   Leukocytes, UA TRACE (*) NEGATIVE  URINE MICROSCOPIC-ADD ON      Result Value Range   Squamous Epithelial / LPF RARE  RARE   WBC, UA 0-2  <3 WBC/hpf   RBC / HPF 21-50  <3 RBC/hpf  POCT PREGNANCY, URINE      Result Value Range   Preg Test, Ur NEGATIVE  NEGATIVE   Ct Abdomen Pelvis Wo Contrast  07/09/2012   *RADIOLOGY REPORT*  Clinical Data: Left abdominal pain for several days  CT ABDOMEN AND PELVIS WITHOUT CONTRAST  Technique:  Multidetector CT imaging of the abdomen and pelvis was performed following the standard protocol without intravenous  contrast.  Comparison: Prior hysterosalpingogram from 02/16/2018 10/14 and CT from 08/11/2005  Findings: The visualized lung bases are clear.  There is consistent with hepatic steatosis.  The liver is otherwise unremarkable on this noncontrast examination.  The spleen, adrenal glands, and pancreas are unremarkable.  The gallbladder is surgically absent.  The kidneys are unremarkable without evidence of focal lesion or stone.  No hydronephrosis.  No stones are seen along the course of either ureter or within the bladder.  The bladder is partially distended and not well evaluated.  No overt evidence of bowel wall thickening is seen on this noncontrast examination.  The appendix is normal.  Essure devices are noted in the fallopian tubes bilaterally.  The uterus is grossly unremarkable.  No free air or free fluid.  No pathologically enlarged lymph nodes.  No osseous abnormalities.  IMPRESSION:  No acute CT findings in the abdomen or pelvis.   Original Report Authenticated By: Rise Mu, M.D.     1. Abdominal pain   2. Hyponatremia   3. Hematuria   4. Elevated transaminase level     MDM  Left upper quadrant pain of uncertain cause. I reviewed her note from urgent care where a urine had 0-1 wbc's although dipstick did show small blood. I prior ultrasounds and CT scans have not shown any evidence of nephrolithiasis. She will be  given IV hydromorphone and IV ondansetron and screening labs have been ordered as well as urinalysis. She'll be sent for CT scan.  CT scan does not show obvious cause for her pain. Urinalysis is significant for microscopic hematuria. Incidentally noted are mild hyponatremia and mild elevation of transaminase. These can be followed as an outpatient. Reexam, abdomen continues to be benign. She is discharged with prescriptions for oxycodone-acetaminophen, and ondansetron. She is to followup with her PCP in 2 days.  Dione Booze, MD 07/09/12 [phone]

## 2012-07-08 NOTE — ED Notes (Signed)
Pt c/o Left upper quad pain, localized, recent left ovarian cyst, was told she may have a kidney stone, worse after eating

## 2012-07-09 MED ORDER — ONDANSETRON HCL 4 MG PO TABS: 4.0000 mg | ORAL_TABLET | Freq: Four times a day (QID) | ORAL | Status: DC | PRN

## 2012-07-09 MED ORDER — HYDROMORPHONE HCL PF 1 MG/ML IJ SOLN
1.0000 mg | Freq: Once | INTRAMUSCULAR | Status: AC
  Administered 2012-07-09: 1 mg via INTRAVENOUS
  Filled 2012-07-09: qty 1

## 2012-07-09 MED ORDER — OXYCODONE-ACETAMINOPHEN 5-325 MG PO TABS: 1.0000 | ORAL_TABLET | ORAL | Status: DC | PRN

## 2012-07-09 MED ORDER — KETOROLAC TROMETHAMINE 30 MG/ML IJ SOLN
30.0000 mg | Freq: Once | INTRAMUSCULAR | Status: AC
  Administered 2012-07-09: 30 mg via INTRAVENOUS
  Filled 2012-07-09: qty 1

## 2012-07-19 ENCOUNTER — Emergency Department (HOSPITAL_COMMUNITY)
Admission: EM | Admit: 2012-07-19 | Discharge: 2012-07-19 | Disposition: A | Discharge status: Home / Self Care | Payer: 59 | Attending: Emergency Medicine | Admitting: Emergency Medicine

## 2012-07-19 ENCOUNTER — Encounter (HOSPITAL_COMMUNITY): Payer: Self-pay | Admitting: Emergency Medicine

## 2012-07-19 DIAGNOSIS — M549 Dorsalgia, unspecified: Secondary | ICD-10-CM | POA: Insufficient documentation

## 2012-07-19 DIAGNOSIS — R109 Unspecified abdominal pain: Secondary | ICD-10-CM

## 2012-07-19 DIAGNOSIS — Z87891 Personal history of nicotine dependence: Secondary | ICD-10-CM | POA: Insufficient documentation

## 2012-07-19 DIAGNOSIS — F411 Generalized anxiety disorder: Secondary | ICD-10-CM | POA: Insufficient documentation

## 2012-07-19 DIAGNOSIS — N949 Unspecified condition associated with female genital organs and menstrual cycle: Secondary | ICD-10-CM | POA: Insufficient documentation

## 2012-07-19 DIAGNOSIS — Z862 Personal history of diseases of the blood and blood-forming organs and certain disorders involving the immune mechanism: Secondary | ICD-10-CM | POA: Insufficient documentation

## 2012-07-19 DIAGNOSIS — R6883 Chills (without fever): Secondary | ICD-10-CM | POA: Insufficient documentation

## 2012-07-19 DIAGNOSIS — Z3202 Encounter for pregnancy test, result negative: Secondary | ICD-10-CM | POA: Insufficient documentation

## 2012-07-19 DIAGNOSIS — Z8742 Personal history of other diseases of the female genital tract: Secondary | ICD-10-CM | POA: Insufficient documentation

## 2012-07-19 DIAGNOSIS — I10 Essential (primary) hypertension: Secondary | ICD-10-CM | POA: Insufficient documentation

## 2012-07-19 DIAGNOSIS — Z79899 Other long term (current) drug therapy: Secondary | ICD-10-CM | POA: Insufficient documentation

## 2012-07-19 DIAGNOSIS — R51 Headache: Secondary | ICD-10-CM | POA: Insufficient documentation

## 2012-07-19 DIAGNOSIS — E119 Type 2 diabetes mellitus without complications: Secondary | ICD-10-CM | POA: Insufficient documentation

## 2012-07-19 DIAGNOSIS — Z789 Other specified health status: Secondary | ICD-10-CM | POA: Insufficient documentation

## 2012-07-19 DIAGNOSIS — Z8639 Personal history of other endocrine, nutritional and metabolic disease: Secondary | ICD-10-CM | POA: Insufficient documentation

## 2012-07-19 DIAGNOSIS — F3289 Other specified depressive episodes: Secondary | ICD-10-CM | POA: Insufficient documentation

## 2012-07-19 DIAGNOSIS — N390 Urinary tract infection, site not specified: Secondary | ICD-10-CM

## 2012-07-19 DIAGNOSIS — R110 Nausea: Secondary | ICD-10-CM | POA: Insufficient documentation

## 2012-07-19 DIAGNOSIS — F329 Major depressive disorder, single episode, unspecified: Secondary | ICD-10-CM | POA: Insufficient documentation

## 2012-07-19 LAB — CBC WITH DIFFERENTIAL/PLATELET
HCT: 36.4 % (ref 36.0–46.0)
Hemoglobin: 12.9 g/dL (ref 12.0–15.0)
Lymphocytes Relative: 46 % (ref 12–46)
Lymphs Abs: 4 10*3/uL (ref 0.7–4.0)
Monocytes Absolute: 0.6 10*3/uL (ref 0.1–1.0)
Monocytes Relative: 7 % (ref 3–12)
Neutro Abs: 3.9 10*3/uL (ref 1.7–7.7)
WBC: 8.6 10*3/uL (ref 4.0–10.5)

## 2012-07-19 LAB — CG4 I-STAT (LACTIC ACID): Lactic Acid, Venous: 1.32 mmol/L (ref 0.5–2.2)

## 2012-07-19 LAB — COMPREHENSIVE METABOLIC PANEL
AST: 38 U/L — ABNORMAL HIGH (ref 0–37)
BUN: 11 mg/dL (ref 6–23)
CO2: 26 mEq/L (ref 19–32)
Chloride: 100 mEq/L (ref 96–112)
Creatinine, Ser: 0.66 mg/dL (ref 0.50–1.10)
GFR calc non Af Amer: >90 mL/min (ref >90)
Total Bilirubin: 0.3 mg/dL (ref 0.3–1.2)

## 2012-07-19 LAB — POCT PREGNANCY, URINE: Preg Test, Ur: NEGATIVE

## 2012-07-19 LAB — URINALYSIS, ROUTINE W REFLEX MICROSCOPIC
Glucose, UA: NEGATIVE mg/dL
pH: 5.5 (ref 5.0–8.0)

## 2012-07-19 LAB — URINE MICROSCOPIC-ADD ON

## 2012-07-19 MED ORDER — SODIUM CHLORIDE 0.9 % IV BOLUS (SEPSIS)
1000.0000 mL | Freq: Once | INTRAVENOUS | Status: AC
  Administered 2012-07-19: 1000 mL via INTRAVENOUS

## 2012-07-19 MED ORDER — MORPHINE SULFATE 4 MG/ML IJ SOLN
2.0000 mg | Freq: Once | INTRAMUSCULAR | Status: AC
  Administered 2012-07-19: 2 mg via INTRAVENOUS
  Filled 2012-07-19: qty 1

## 2012-07-19 MED ORDER — TRAMADOL HCL 50 MG PO TABS: 100.0000 mg | ORAL_TABLET | Freq: Four times a day (QID) | ORAL | Status: DC | PRN

## 2012-07-19 MED ORDER — MORPHINE SULFATE 4 MG/ML IJ SOLN
4.0000 mg | Freq: Once | INTRAMUSCULAR | Status: AC
  Administered 2012-07-19: 4 mg via INTRAVENOUS
  Filled 2012-07-19: qty 1

## 2012-07-19 MED ORDER — CIPROFLOXACIN HCL 500 MG PO TABS: 500.0000 mg | ORAL_TABLET | Freq: Two times a day (BID) | ORAL | Status: DC

## 2012-07-19 NOTE — ED Notes (Addendum)
Patient states is unable to void. "In too much pain." RN [place] notified

## 2012-07-19 NOTE — ED Notes (Signed)
Pt complains of left abdominal pain and pelvic pain radiating to back. Pt states that she was seen here for this and things are "getting worse"

## 2012-07-19 NOTE — ED Provider Notes (Signed)
History    CSN: [id number] Arrival date & time 07/19/12  1703  First MD Initiated Contact with Patient 07/19/12 1821     Chief Complaint  Patient presents with  . Abdominal Pain   (Consider location/radiation/quality/duration/timing/severity/associated sxs/prior Treatment) HPI Comments: Patient is a 27 year old female history significant for hypertension, depression, anxiety, diabetes mellitus presented to emergency department for worsening waxing and waning pressure left-sided abdominal pain. Patient has associated nausea. She rates her pain 9/10. Denies alleviating or aggravating factors. Patient states she's had this pain for over 3 weeks has been evaluated by her PCP, cardiologist, urologist, gastroenterologist, and endocrinologist without findings. Patient states she's also been evaluated here in the ED with a negative workup. Patient's surgical history includes cholecystectomy in 2011. Last menstrual period was June 25.  Patient is a 27 y.o. female presenting with abdominal pain.  Abdominal Pain Associated symptoms include abdominal pain, chills, headaches and nausea. Pertinent negatives include no chest pain, fever or neck pain.    Past Medical History  Diagnosis Date  . Hypertension   . Thyroid disease     hyperthyroid  . PFO (patent foramen ovale)   . Depression   . Headache(784.0)   . Sterilization 12/09/2011  . Difficult intubation 12/09/2011  . Anxiety   . Diabetes mellitus without complication    Past Surgical History  Procedure Laterality Date  . Wisdom tooth extraction  2010  . Dilation and evacuation  2009  . Cholecystectomy open  2011  . Cryotherapy    . Dilitation & currettage/hystroscopy with essure  12/09/2011    Procedure: DILATATION & CURETTAGE/HYSTEROSCOPY WITH ESSURE;  Surgeon: Robley Fries, MD;  Location: [facility];  Service: Gynecology;  Laterality: N/A;   Family History  Problem Relation Age of Onset  . Hypertension Mother   . Hypothyroidism  Mother   . Hypertension Father   . Alcohol abuse Sister   . Asthma Son   . Allergies Son   . Diabetes Paternal Grandmother    History  Substance Use Topics  . Smoking status: Former Smoker -- 0.15 packs/day for 2 years    Types: Cigarettes    Quit date: 01/11/2004  . Smokeless tobacco: Never Used  . Alcohol Use: 0.0 oz/week    3-5 Glasses of wine per week     Comment: occasionally   OB History   Grav Para Term Preterm Abortions TAB SAB Ect Mult Living   3 2 1 1 1  0 1 0 0 2     Review of Systems  Constitutional: Positive for chills. Negative for fever.  HENT: Negative for neck pain and neck stiffness.   Eyes: Negative.   Respiratory: Negative for shortness of breath.   Cardiovascular: Negative for chest pain.  Gastrointestinal: Positive for nausea and abdominal pain.  Genitourinary: Positive for pelvic pain.  Musculoskeletal: Positive for back pain.  Skin: Negative.   Neurological: Positive for headaches.    Allergies  Review of patient's allergies indicates no known allergies.  Home Medications   Current Outpatient Rx  Name  Route  Sig  Dispense  Refill  . ALPRAZolam (XANAX) 0.5 MG tablet   Oral   Take 0.5 mg by mouth 2 (two) times daily as needed for anxiety.          . butalbital-acetaminophen-caffeine (FIORICET, ESGIC) 50-325-40 MG per tablet   Oral   Take 1 tablet by mouth 2 (two) times daily as needed for headache.         . cholecalciferol (VITAMIN  D) 1000 UNITS tablet   Oral   Take 2,000 Units by mouth every morning.          Marland Kitchen losartan (COZAAR) 25 MG tablet   Oral   Take 25 mg by mouth every morning.          . metFORMIN (GLUCOPHAGE) 500 MG tablet   Oral   Take 250 mg by mouth 2 (two) times daily with a meal.         . ondansetron (ZOFRAN) 4 MG tablet   Oral   Take 4 mg by mouth every 6 (six) hours as needed for nausea.         Marland Kitchen PARoxetine (PAXIL) 40 MG tablet   Oral   Take 40 mg by mouth every morning.         .  ciprofloxacin (CIPRO) 500 MG tablet   Oral   Take 1 tablet (500 mg total) by mouth 2 (two) times daily.   14 tablet   0   . traMADol (ULTRAM) 50 MG tablet   Oral   Take 2 tablets (100 mg total) by mouth every 6 (six) hours as needed for pain.   15 tablet   0    BP 136/84  Pulse 92  Temp(Src) 97.9 F (36.6 C) (Oral)  Resp 16  SpO2 99%  LMP 07/06/2012 Physical Exam  Constitutional: She is oriented to person, place, and time. She appears well-developed and well-nourished. No distress.  HENT:  Head: Normocephalic and atraumatic.  Eyes: Conjunctivae are normal.  Neck: Neck supple.  Cardiovascular: Normal rate, regular rhythm and normal heart sounds.   Pulmonary/Chest: Effort normal and breath sounds normal. No respiratory distress.  Abdominal: Soft. Bowel sounds are normal. She exhibits no distension and no mass. There is no tenderness. There is no rigidity, no rebound and no guarding. No hernia.  Abdomen non tender on distraction exam.   Neurological: She is alert and oriented to person, place, and time.  Skin: Skin is warm and dry. She is not diaphoretic.  Psychiatric: She has a normal mood and affect.    ED Course  Procedures (including critical care time)  Medications  sodium chloride 0.9 % bolus 1,000 mL (0 mLs Intravenous Stopped 07/19/12 2013)  morphine 4 MG/ML injection 4 mg (4 mg Intravenous Given 07/19/12 1911)  morphine 4 MG/ML injection 2 mg (2 mg Intravenous Given 07/19/12 1952)    Labs Reviewed  URINALYSIS, ROUTINE W REFLEX MICROSCOPIC - Abnormal; Notable for the following:    APPearance CLOUDY (*)    Leukocytes, UA SMALL (*)    All other components within normal limits  COMPREHENSIVE METABOLIC PANEL - Abnormal; Notable for the following:    AST 38 (*)    ALT 77 (*)    All other components within normal limits  URINE MICROSCOPIC-ADD ON - Abnormal; Notable for the following:    Squamous Epithelial / LPF FEW (*)    Bacteria, UA FEW (*)    All other  components within normal limits  CBC WITH DIFFERENTIAL  POCT PREGNANCY, URINE  CG4 I-STAT (LACTIC ACID)   No results found. 1. Abdominal  pain, other specified site   2. UTI (urinary tract infection)     MDM  Patient is nontoxic, nonseptic appearing, in no apparent distress.  Patient is sitting in her room talking to family and taxing on her phone. Patient is requesting extensive imaging including an MRI of her abdomen. Discussed with patient that this was unnecessary.  Fluid bolus  given.  Labs and vitals reviewed.  Imaging indicated at this time. Patient has been worked up multiple times including once in ED for his chronic abdominal pain without findings. Patient does not meet the SIRS or Sepsis criteria.  On repeat exam patient again does not have a surgical abdomen and there are nor peritoneal signs.  On each evaluation of patient her abdominal pain shifts location all around the abdomen. No indication of appendicitis, bowel obstruction, bowel perforation, cholecystitis, diverticulitis, ectopic pregnancy. Pt has been diagnosed with a UTI. Pt is afebrile, no CVA tenderness, normotensive. Pt not nauseous or vomiting in ED. Pt to be dc home with antibiotics and instructions to follow up with PCP if symptoms persist. Patient discharged home with symptomatic treatment and given strict instructions for follow-up with their primary care physician.  I have also discussed reasons to return immediately to the ER.  Patient expresses understanding and agrees with plan. Patient stable at time of discharge.      Jeannetta Ellis, PA-C 07/20/12 0011

## 2012-07-22 NOTE — ED Provider Notes (Signed)
Medical screening examination/treatment/procedure(s) were performed by non-physician practitioner and as supervising physician I was immediately available for consultation/collaboration.   Sameka Bagent L Coretta Leisey, MD 07/22/12 1113 

## 2012-07-25 ENCOUNTER — Other Ambulatory Visit: Payer: Self-pay | Admitting: Gastroenterology

## 2012-07-25 DIAGNOSIS — R1013 Epigastric pain: Secondary | ICD-10-CM

## 2012-08-01 ENCOUNTER — Other Ambulatory Visit: Payer: 59

## 2012-10-17 ENCOUNTER — Ambulatory Visit (HOSPITAL_COMMUNITY)
Admission: RE | Admit: 2012-10-17 | Discharge: 2012-10-17 | Disposition: A | Discharge status: Home / Self Care | Payer: 59 | Source: Ambulatory Visit | Attending: Family Medicine | Admitting: Family Medicine

## 2012-10-17 ENCOUNTER — Other Ambulatory Visit: Payer: Self-pay | Admitting: Family Medicine

## 2012-10-17 DIAGNOSIS — S02401A Maxillary fracture, unspecified, initial encounter for closed fracture: Secondary | ICD-10-CM

## 2012-10-17 DIAGNOSIS — R519 Headache, unspecified: Secondary | ICD-10-CM

## 2012-10-17 DIAGNOSIS — J358 Other chronic diseases of tonsils and adenoids: Secondary | ICD-10-CM | POA: Insufficient documentation

## 2012-10-17 DIAGNOSIS — W208XXA Other cause of strike by thrown, projected or falling object, initial encounter: Secondary | ICD-10-CM | POA: Insufficient documentation

## 2012-10-17 DIAGNOSIS — R51 Headache: Secondary | ICD-10-CM | POA: Insufficient documentation

## 2012-10-17 DIAGNOSIS — R220 Localized swelling, mass and lump, head: Secondary | ICD-10-CM | POA: Insufficient documentation

## 2012-10-18 ENCOUNTER — Telehealth: Payer: Self-pay | Admitting: Family Medicine

## 2012-10-18 NOTE — Telephone Encounter (Signed)
Spoke to pt, she is aware of ct results, still experiencing headache, nausea w/o vomiting, tylenol stopped helping headache, began taking 600mg  ibuprofen, headache in not worsening, was advised to go to ED if she feels symptoms are worsening,  otherwise follow up as scheduled on 10/19/12 with Dr. Neva Seat.

## 2012-11-15 ENCOUNTER — Other Ambulatory Visit: Payer: Self-pay

## 2013-03-28 ENCOUNTER — Ambulatory Visit (INDEPENDENT_AMBULATORY_CARE_PROVIDER_SITE_OTHER): Payer: BC Managed Care – PPO | Admitting: Family Medicine

## 2013-03-28 VITALS — BP 120/80 | HR 96 | Temp 97.9°F | Resp 16 | Ht 61.5 in | Wt 181.0 lb

## 2013-03-28 DIAGNOSIS — G43009 Migraine without aura, not intractable, without status migrainosus: Secondary | ICD-10-CM

## 2013-03-28 MED ORDER — NALBUPHINE HCL 10 MG/ML IJ SOLN
10.0000 mg | Freq: Once | INTRAMUSCULAR | Status: AC
  Administered 2013-03-28: 10 mg via INTRAMUSCULAR

## 2013-03-28 NOTE — Progress Notes (Signed)
[facility] [address], [place] [address] [phone]  Date:  03/28/2013   Name:  Kristen Klein   DOB:  1985-04-24   MRN:  [id number]  PCP:  Gaye AlkenBARNES,ELIZABETH STEWART, MD    Chief Complaint: Migraine   History of Present Illness:  Kristen KindleStephanie E Klein is a 28 y.o. very pleasant female patient who presents with the following:  Here today with migraine HA.  This has been active for about 2 days.  She has been using fioicet- however this does not seem to be helping.  She feels nauseated.  Her HA is on the left side of her head.   She has had migraine HA since high school.  They got better for a time, but returned over the last 18 months. She may have 1 or 2 a month now.   This does seem like a typical migraine, but it has lasted longer than her typical HA She has not vomited.  She has zofran which she can use.  She has light and sound sensitivity.   She has pain with bending her head down.   She does not get aura- she does have eye pain however.    So far today she took an aleve, Fioricet at around 0500.  She did drive here today.   She has essure procedure- her LMP was 03/17/13.  No xanax today so far  She did have a concussion in October and is fully recovered from this. No trauma this time.    Patient Active Problem List   Diagnosis Date Noted  . Sterilization 12/09/2011  . Difficult intubation 12/09/2011    Past Medical History  Diagnosis Date  . Hypertension   . Thyroid disease     hyperthyroid  . PFO (patent foramen ovale)   . Depression   . Headache(784.0)   . Sterilization 12/09/2011  . Difficult intubation 12/09/2011  . Anxiety   . Diabetes mellitus without complication     Past Surgical History  Procedure Laterality Date  . Wisdom tooth extraction  2010  . Dilation and evacuation  2009  . Cholecystectomy open  2011  . Cryotherapy    . Dilitation & currettage/hystroscopy with essure  12/09/2011    Procedure: DILATATION &  CURETTAGE/HYSTEROSCOPY WITH ESSURE;  Surgeon: Robley FriesVaishali R Mody, MD;  Location: [facility];  Service: Gynecology;  Laterality: N/A;    History  Substance Use Topics  . Smoking status: Former Smoker -- 0.15 packs/day for 2 years    Types: Cigarettes    Quit date: 01/11/2004  . Smokeless tobacco: Never Used  . Alcohol Use: 0.0 oz/week    3-5 Glasses of wine per week     Comment: occasionally    Family History  Problem Relation Age of Onset  . Hypertension Mother   . Hypothyroidism Mother   . Hypertension Father   . Alcohol abuse Sister   . Asthma Son   . Allergies Son   . Diabetes Paternal Grandmother     No Known Allergies  Medication list has been reviewed and updated.  Current Outpatient Prescriptions on File Prior to Visit  Medication Sig Dispense Refill  . ALPRAZolam (XANAX) 0.5 MG tablet Take 0.5 mg by mouth 2 (two) times daily as needed for anxiety.       . butalbital-acetaminophen-caffeine (FIORICET, ESGIC) 50-325-40 MG per tablet Take 1 tablet by mouth 2 (two) times daily as needed for headache.      . cholecalciferol (VITAMIN D) 1000  UNITS tablet Take 2,000 Units by mouth every morning.       Marland Kitchen losartan (COZAAR) 25 MG tablet Take 25 mg by mouth every morning.       . metFORMIN (GLUCOPHAGE) 500 MG tablet Take 250 mg by mouth 2 (two) times daily with a meal.      . ondansetron (ZOFRAN) 4 MG tablet Take 4 mg by mouth every 6 (six) hours as needed for nausea.      Marland Kitchen PARoxetine (PAXIL) 40 MG tablet Take 40 mg by mouth every morning.      . traMADol (ULTRAM) 50 MG tablet Take 2 tablets (100 mg total) by mouth every 6 (six) hours as needed for pain.  15 tablet  0   No current facility-administered medications on file prior to visit.    Review of Systems:  As per HPI- otherwise negative.   Physical Examination: Filed Vitals:   03/28/13 1032  BP: 120/80  Pulse: 96  Temp: 97.9 F (36.6 C)  Resp: 16   Filed Vitals:   03/28/13 1032  Height: 5' 1.5" (1.562 m)  Weight:  181 lb (82.101 kg)   Body mass index is 33.65 kg/(m^2). Ideal Body Weight: Weight in (lb) to have BMI = 25: 134.2  GEN: WDWN, NAD, Non-toxic, A & O x 3, overweight, looks well HEENT: Atraumatic, Normocephalic. Neck supple. No masses, No LAD.  Bilateral TM wnl, oropharynx normal.  PEERL,EOMI.   No meningismus Ears and Nose: No external deformity. CV: RRR, No M/G/R. No JVD. No thrill. No extra heart sounds. PULM: CTA B, no wheezes, crackles, rhonchi. No retractions. No resp. distress. No accessory muscle use. ABD: S, NT, ND. No rebound. No HSM. EXTR: No c/c/e NEURO Normal gait. Normal strength, sensation and DTR all extremities.  Normal romberg.   PSYCH: Normally interactive. Conversant. Not depressed or anxious appearing.  Calm demeanor.    Assessment and Plan: Migraine without aura - Plan: nalbuphine (NUBAIN) injection 10 mg  Typical migraine HA.   She would like to try nubain to get rid of her HA.  She has had one fiorcet about 7 hours prior to visit.  Discussed possibility of other etiology such as bleed or tumor.  Offered a CT scan.  For now she would like to defer CT.   Treated with 10mg  of nubain for migraine HA.  Husband to drive her home.  She will follow-up if not better this afternoon.    Called to check on her at 7pm.  She is feeling better- HA not gone but much improved.  She plans to get a good night's sleep and will follow-up tomorrow if not well  Signed Abbe Amsterdam, MD

## 2013-03-28 NOTE — Patient Instructions (Addendum)
Do not drive the rest of the day- the nubain will make you sleepy.  If you are NOT better by this evening please give me a call.  Don't use your xanax today

## 2013-03-29 ENCOUNTER — Encounter: Payer: Self-pay | Admitting: *Deleted

## 2013-03-29 ENCOUNTER — Telehealth: Payer: Self-pay

## 2013-03-29 ENCOUNTER — Ambulatory Visit (HOSPITAL_COMMUNITY)
Admission: RE | Admit: 2013-03-29 | Discharge: 2013-03-29 | Disposition: A | Discharge status: Home / Self Care | Payer: BC Managed Care – HMO | Source: Ambulatory Visit | Attending: Family Medicine | Admitting: Family Medicine

## 2013-03-29 DIAGNOSIS — R51 Headache: Secondary | ICD-10-CM

## 2013-03-29 NOTE — Telephone Encounter (Signed)
Patient called regarding her CT scan. I advised her that she will need to go to [facility] (Radiology dept). Patient would like to ask what else she can do for her migraine until she can get an injection tomorrow (she cannot make it here before we close at 6pm). Thank you.

## 2013-03-29 NOTE — Telephone Encounter (Signed)
Reviewed OV- pt originally deferred CT.- Dr. Patsy Lageropland spoke to her las night and she was better not 100%

## 2013-03-29 NOTE — Telephone Encounter (Signed)
She would like to go ahead and schedule this CT.

## 2013-03-29 NOTE — Telephone Encounter (Signed)
DR.COPLAND, PT STATES THAT SHE DOES NOT FEEL ANY BETTER, HER MIGRAINE IS STILL LINGERING, SHE WOULD LIKE TO KNOW WHAT THE NEXT STEP WILL BE. BEST# [phone]

## 2013-03-30 NOTE — Telephone Encounter (Signed)
Called and discussed with her around 1pm.  Her HA continued- at this time she feels this HA is unusual for her as it is lasting longer than her typical migraine.  She would like to proceed with CT.  Scheduled this and we gave her a call.  The plan had been for her to come in for a repeat nubain shot after her CT, but this was not possible with the timing of her scan.  We had some trouble getting in touch with her regarding her scan appt and she was delayed.  Called her in the evening after receiving her scan results; did not reach her but LMOM.  CT is normal, asked her to please come to clinic in the am for a recheck.  Can go to ED tonight if more immediate help is needed.

## 2013-03-31 NOTE — Telephone Encounter (Signed)
Left message to return call 

## 2013-04-01 NOTE — Telephone Encounter (Signed)
Pt was seen Sunday. This issue has been addressed during that visit.

## 2013-04-15 ENCOUNTER — Emergency Department (HOSPITAL_COMMUNITY)
Admission: EM | Admit: 2013-04-15 | Discharge: 2013-04-15 | Disposition: A | Discharge status: Home / Self Care | Payer: BC Managed Care – PPO | Attending: Emergency Medicine | Admitting: Emergency Medicine

## 2013-04-15 ENCOUNTER — Emergency Department (HOSPITAL_COMMUNITY): Payer: BC Managed Care – PPO

## 2013-04-15 ENCOUNTER — Encounter (HOSPITAL_COMMUNITY): Payer: Self-pay | Admitting: Emergency Medicine

## 2013-04-15 DIAGNOSIS — R079 Chest pain, unspecified: Secondary | ICD-10-CM | POA: Insufficient documentation

## 2013-04-15 DIAGNOSIS — F329 Major depressive disorder, single episode, unspecified: Secondary | ICD-10-CM | POA: Insufficient documentation

## 2013-04-15 DIAGNOSIS — R0602 Shortness of breath: Secondary | ICD-10-CM | POA: Insufficient documentation

## 2013-04-15 DIAGNOSIS — Q2111 Secundum atrial septal defect: Secondary | ICD-10-CM | POA: Insufficient documentation

## 2013-04-15 DIAGNOSIS — I10 Essential (primary) hypertension: Secondary | ICD-10-CM | POA: Insufficient documentation

## 2013-04-15 DIAGNOSIS — E119 Type 2 diabetes mellitus without complications: Secondary | ICD-10-CM | POA: Insufficient documentation

## 2013-04-15 DIAGNOSIS — R51 Headache: Secondary | ICD-10-CM | POA: Insufficient documentation

## 2013-04-15 DIAGNOSIS — F3289 Other specified depressive episodes: Secondary | ICD-10-CM | POA: Insufficient documentation

## 2013-04-15 DIAGNOSIS — Z79899 Other long term (current) drug therapy: Secondary | ICD-10-CM | POA: Insufficient documentation

## 2013-04-15 DIAGNOSIS — F411 Generalized anxiety disorder: Secondary | ICD-10-CM | POA: Insufficient documentation

## 2013-04-15 DIAGNOSIS — Q211 Atrial septal defect: Secondary | ICD-10-CM | POA: Insufficient documentation

## 2013-04-15 DIAGNOSIS — Z87891 Personal history of nicotine dependence: Secondary | ICD-10-CM | POA: Insufficient documentation

## 2013-04-15 LAB — CBC WITH DIFFERENTIAL/PLATELET
BASOS PCT: 0 % (ref 0–1)
Basophils Absolute: 0 10*3/uL (ref 0.0–0.1)
EOS ABS: 0.1 10*3/uL (ref 0.0–0.7)
Eosinophils Relative: 1 % (ref 0–5)
HCT: 41.5 % (ref 36.0–46.0)
Hemoglobin: 14.6 g/dL (ref 12.0–15.0)
LYMPHS ABS: 3.9 10*3/uL (ref 0.7–4.0)
Lymphocytes Relative: 45 % (ref 12–46)
MCH: 29.8 pg (ref 26.0–34.0)
MCHC: 35.2 g/dL (ref 30.0–36.0)
MCV: 84.7 fL (ref 78.0–100.0)
Monocytes Absolute: 0.6 10*3/uL (ref 0.1–1.0)
Monocytes Relative: 7 % (ref 3–12)
NEUTROS PCT: 48 % (ref 43–77)
Neutro Abs: 4.1 10*3/uL (ref 1.7–7.7)
PLATELETS: 262 10*3/uL (ref 150–400)
RBC: 4.9 MIL/uL (ref 3.87–5.11)
RDW: 12.8 % (ref 11.5–15.5)
WBC: 8.7 10*3/uL (ref 4.0–10.5)

## 2013-04-15 LAB — COMPREHENSIVE METABOLIC PANEL
ALT: 86 U/L — ABNORMAL HIGH (ref 0–35)
AST: 42 U/L — AB (ref 0–37)
Albumin: 4.5 g/dL (ref 3.5–5.2)
Alkaline Phosphatase: 88 U/L (ref 39–117)
BILIRUBIN TOTAL: 0.5 mg/dL (ref 0.3–1.2)
BUN: 10 mg/dL (ref 6–23)
CHLORIDE: 100 meq/L (ref 96–112)
CO2: 22 meq/L (ref 19–32)
CREATININE: 0.6 mg/dL (ref 0.50–1.10)
Calcium: 9.9 mg/dL (ref 8.4–10.5)
GFR calc Af Amer: >90 mL/min (ref >90)
Glucose, Bld: 101 mg/dL — ABNORMAL HIGH (ref 70–99)
Potassium: 3.6 mEq/L — ABNORMAL LOW (ref 3.7–5.3)
Sodium: 139 mEq/L (ref 137–147)
Total Protein: 8.4 g/dL — ABNORMAL HIGH (ref 6.0–8.3)

## 2013-04-15 LAB — TROPONIN I

## 2013-04-15 LAB — D-DIMER, QUANTITATIVE (NOT AT ARMC)

## 2013-04-15 MED ORDER — ASPIRIN 325 MG PO TABS
325.0000 mg | ORAL_TABLET | Freq: Every day | ORAL | Status: DC
  Administered 2013-04-15: 325 mg via ORAL
  Filled 2013-04-15: qty 1

## 2013-04-15 MED ORDER — NITROGLYCERIN 0.4 MG SL SUBL
0.4000 mg | SUBLINGUAL_TABLET | SUBLINGUAL | Status: DC | PRN
  Administered 2013-04-15 (×2): 0.4 mg via SUBLINGUAL
  Filled 2013-04-15: qty 1

## 2013-04-15 NOTE — ED Provider Notes (Signed)
Medical screening examination/treatment/procedure(s) were performed by non-physician practitioner and as supervising physician I was immediately available for consultation/collaboration.   EKG Interpretation   Date/Time:  Monday April 15 2013 17:16:01 EDT Ventricular Rate:  88 PR Interval:  139 QRS Duration: 98 QT Interval:  365 QTC Calculation: 442 R Axis:   78 Text Interpretation:  Sinus rhythm RSR' in V1 or V2, right VCD or RVH  Similar to prior Confirmed by Gwendolyn GrantWALDEN  MD, Latania Bascomb ([id number]) on 04/15/2013 7:01:06  PM        Dagmar HaitWilliam Gabrielly Mccrystal, MD 04/15/13 2351

## 2013-04-15 NOTE — ED Notes (Signed)
Patient transported to X-ray 

## 2013-04-15 NOTE — Discharge Instructions (Signed)
Chest Pain (Nonspecific) °It is often hard to give a specific diagnosis for the cause of chest pain. There is always a chance that your pain could be related to something serious, such as a heart attack or a blood clot in the lungs. You need to follow up with your caregiver for further evaluation. °CAUSES  °· Heartburn. °· Pneumonia or bronchitis. °· Anxiety or stress. °· Inflammation around your heart (pericarditis) or lung (pleuritis or pleurisy). °· A blood clot in the lung. °· A collapsed lung (pneumothorax). It can develop suddenly on its own (spontaneous pneumothorax) or from injury (trauma) to the chest. °· Shingles infection (herpes zoster virus). °The chest wall is composed of bones, muscles, and cartilage. Any of these can be the source of the pain. °· The bones can be bruised by injury. °· The muscles or cartilage can be strained by coughing or overwork. °· The cartilage can be affected by inflammation and become sore (costochondritis). °DIAGNOSIS  °Lab tests or other studies, such as X-rays, electrocardiography, stress testing, or cardiac imaging, may be needed to find the cause of your pain.  °TREATMENT  °· Treatment depends on what may be causing your chest pain. Treatment may include: °· Acid blockers for heartburn. °· Anti-inflammatory medicine. °· Pain medicine for inflammatory conditions. °· Antibiotics if an infection is present. °· You may be advised to change lifestyle habits. This includes stopping smoking and avoiding alcohol, caffeine, and chocolate. °· You may be advised to keep your head raised (elevated) when sleeping. This reduces the chance of acid going backward from your stomach into your esophagus. °· Most of the time, nonspecific chest pain will improve within 2 to 3 days with rest and mild pain medicine. °HOME CARE INSTRUCTIONS  °· If antibiotics were prescribed, take your antibiotics as directed. Finish them even if you start to feel better. °· For the next few days, avoid physical  activities that bring on chest pain. Continue physical activities as directed. °· Do not smoke. °· Avoid drinking alcohol. °· Only take over-the-counter or prescription medicine for pain, discomfort, or fever as directed by your caregiver. °· Follow your caregiver's suggestions for further testing if your chest pain does not go away. °· Keep any follow-up appointments you made. If you do not go to an appointment, you could develop lasting (chronic) problems with pain. If there is any problem keeping an appointment, you must call to reschedule. °SEEK MEDICAL CARE IF:  °· You think you are having problems from the medicine you are taking. Read your medicine instructions carefully. °· Your chest pain does not go away, even after treatment. °· You develop a rash with blisters on your chest. °SEEK IMMEDIATE MEDICAL CARE IF:  °· You have increased chest pain or pain that spreads to your arm, neck, jaw, back, or abdomen. °· You develop shortness of breath, an increasing cough, or you are coughing up blood. °· You have severe back or abdominal pain, feel nauseous, or vomit. °· You develop severe weakness, fainting, or chills. °· You have a fever. °THIS IS AN EMERGENCY. Do not wait to see if the pain will go away. Get medical help at once. Call your local emergency services (911 in [place] Do not drive yourself to the hospital. °MAKE SURE YOU:  °· Understand these instructions. °· Will watch your condition. °· Will get help right away if you are not doing well or get worse. °Document Released: 10/06/2004 Document Revised: 03/21/2011 Document Reviewed: 08/02/2007 °ExitCare® Patient Information ©2014 [employer]   [employer]. ° °

## 2013-04-15 NOTE — ED Provider Notes (Signed)
CSN: [id number]     Arrival date & time 04/15/13  1711 History   First MD Initiated Contact with Patient 04/15/13 1730     Chief Complaint  Patient presents with  . Chest Pain  . Shortness of Breath     (Consider location/radiation/quality/duration/timing/severity/associated sxs/prior Treatment) Patient is a 28 y.o. female presenting with chest pain and shortness of breath. The history is provided by the patient. No language interpreter was used.  Chest Pain Pain location:  Substernal area Pain quality: pressure   Pain radiates to the back: no   Pain severity:  Mild Associated symptoms: headache and shortness of breath   Associated symptoms: no abdominal pain, no cough, no fever and not vomiting   Associated symptoms comment:  Substernal chest discomfort that started around noon today. She finished her work day with persistent symptoms and came for evaluation. She reports associated SOB and while the SOB is exertional, the chest pressure is not affected by rest or activity. No nausea, cough, fever. She denies cocaine use. She is a non-smoker, takes medication for high blood pressure. She denies history of clot, recent travel, exogenous estrogens. She reports history of dissimilar chest pain 2 years ago resulting in echo and stress testing that were negative, per patient. She recently started seeing Dr. Vela Prose for migraine management and was put on a 6-day regimen of a medication that ended yesterday and given Imitrex to use no less than 24 hours after ending the first unknown medication. She had a headache today and took Imitrex and was concerned the symptoms were due to these medications. Shortness of Breath Associated symptoms: chest pain and headaches   Associated symptoms: no abdominal pain, no cough, no fever and no vomiting     Past Medical History  Diagnosis Date  . Hypertension   . Thyroid disease     hyperthyroid  . PFO (patent foramen ovale)   . Depression   . Headache(784.0)    . Sterilization 12/09/2011  . Difficult intubation 12/09/2011  . Anxiety   . Diabetes mellitus without complication    Past Surgical History  Procedure Laterality Date  . Wisdom tooth extraction  2010  . Dilation and evacuation  2009  . Cholecystectomy open  2011  . Cryotherapy    . Dilitation & currettage/hystroscopy with essure  12/09/2011    Procedure: DILATATION & CURETTAGE/HYSTEROSCOPY WITH ESSURE;  Surgeon: Robley Fries, MD;  Location: [facility];  Service: Gynecology;  Laterality: N/A;   Family History  Problem Relation Age of Onset  . Hypertension Mother   . Hypothyroidism Mother   . Hypertension Father   . Alcohol abuse Sister   . Asthma Son   . Allergies Son   . Diabetes Paternal Grandmother    History  Substance Use Topics  . Smoking status: Former Smoker -- 0.15 packs/day for 2 years    Types: Cigarettes    Quit date: 01/11/2004  . Smokeless tobacco: Never Used  . Alcohol Use: 0.0 oz/week    3-5 Glasses of wine per week     Comment: occasionally   OB History   Grav Para Term Preterm Abortions TAB SAB Ect Mult Living   3 2 1 1 1  0 1 0 0 2     Review of Systems  Constitutional: Negative for fever and chills.  Respiratory: Positive for shortness of breath. Negative for cough.   Cardiovascular: Positive for chest pain.  Gastrointestinal: Negative.  Negative for vomiting and abdominal pain.  Musculoskeletal: Negative.  Negative for myalgias.  Skin: Negative.   Neurological: Positive for headaches.      Allergies  Review of patient's allergies indicates no known allergies.  Home Medications   Current Outpatient Rx  Name  Route  Sig  Dispense  Refill  . ALPRAZolam (XANAX) 0.5 MG tablet   Oral   Take 0.5 mg by mouth 2 (two) times daily as needed for anxiety.          . cholecalciferol (VITAMIN D) 1000 UNITS tablet   Oral   Take 2,000 Units by mouth every morning.          Marland Kitchen losartan (COZAAR) 25 MG tablet   Oral   Take 25 mg by mouth every  morning.          Marland Kitchen PARoxetine (PAXIL) 40 MG tablet   Oral   Take 40 mg by mouth every morning.         . SUMAtriptan (IMITREX) 100 MG tablet   Oral   Take 100 mg by mouth every 2 (two) hours as needed for migraine or headache (migraine). May repeat in 2 hours if headache persists or recurs.          BP 115/79  Pulse 78  Temp(Src) 98.2 F (36.8 C) (Oral)  Resp 15  SpO2 100%  LMP 03/11/2013 Physical Exam  Constitutional: She is oriented to person, place, and time. She appears well-developed and well-nourished.  HENT:  Head: Normocephalic.  Eyes: Conjunctivae are normal.  Neck: Normal range of motion. Neck supple.  Cardiovascular: Normal rate and regular rhythm.   No murmur heard. Pulmonary/Chest: Effort normal and breath sounds normal. She has no wheezes. She has no rales.  Abdominal: Soft. Bowel sounds are normal. There is no tenderness. There is no rebound and no guarding.  Musculoskeletal: Normal range of motion.  Neurological: She is alert and oriented to person, place, and time. She has normal reflexes. Coordination normal.  Skin: Skin is warm and dry. No rash noted.  Psychiatric: She has a normal mood and affect.    ED Course  Procedures (including critical care time) Labs Review Labs Reviewed  COMPREHENSIVE METABOLIC PANEL - Abnormal; Notable for the following:    Potassium 3.6 (*)    Glucose, Bld 101 (*)    Total Protein 8.4 (*)    AST 42 (*)    ALT 86 (*)    All other components within normal limits  TROPONIN I  CBC WITH DIFFERENTIAL  D-DIMER, QUANTITATIVE  TROPONIN I   Results for orders placed during the hospital encounter of 04/15/13  TROPONIN I      Result Value Ref Range   Troponin I <0.30  <0.30 ng/mL  COMPREHENSIVE METABOLIC PANEL      Result Value Ref Range   Sodium 139  137 - 147 mEq/L   Potassium 3.6 (*) 3.7 - 5.3 mEq/L   Chloride 100  96 - 112 mEq/L   CO2 22  19 - 32 mEq/L   Glucose, Bld 101 (*) 70 - 99 mg/dL   BUN 10  6 - 23 mg/dL    Creatinine, Ser 5.40  0.50 - 1.10 mg/dL   Calcium 9.9  8.4 - 98.1 mg/dL   Total Protein 8.4 (*) 6.0 - 8.3 g/dL   Albumin 4.5  3.5 - 5.2 g/dL   AST 42 (*) 0 - 37 U/L   ALT 86 (*) 0 - 35 U/L   Alkaline Phosphatase 88  39 - 117 U/L   Total  Bilirubin 0.5  0.3 - 1.2 mg/dL   GFR calc non Af Amer >90  >90 mL/min   GFR calc Af Amer >90  >90 mL/min  CBC WITH DIFFERENTIAL      Result Value Ref Range   WBC 8.7  4.0 - 10.5 K/uL   RBC 4.90  3.87 - 5.11 MIL/uL   Hemoglobin 14.6  12.0 - 15.0 g/dL   HCT 16.141.5  09.636.0 - 04.546.0 %   MCV 84.7  78.0 - 100.0 fL   MCH 29.8  26.0 - 34.0 pg   MCHC 35.2  30.0 - 36.0 g/dL   RDW 40.912.8  81.111.5 - 91.415.5 %   Platelets 262  150 - 400 K/uL   Neutrophils Relative % 48  43 - 77 %   Neutro Abs 4.1  1.7 - 7.7 K/uL   Lymphocytes Relative 45  12 - 46 %   Lymphs Abs 3.9  0.7 - 4.0 K/uL   Monocytes Relative 7  3 - 12 %   Monocytes Absolute 0.6  0.1 - 1.0 K/uL   Eosinophils Relative 1  0 - 5 %   Eosinophils Absolute 0.1  0.0 - 0.7 K/uL   Basophils Relative 0  0 - 1 %   Basophils Absolute 0.0  0.0 - 0.1 K/uL  D-DIMER, QUANTITATIVE      Result Value Ref Range   D-Dimer, Quant <0.27  0.00 - 0.48 ug/mL-FEU  TROPONIN I      Result Value Ref Range   Troponin I <0.30  <0.30 ng/mL    Imaging Review Dg Chest 2 View  04/15/2013   CLINICAL DATA:  Anterior left chest discomfort and shortness of breath; history of hypertension and diabetes  EXAM: CHEST  2 VIEW  COMPARISON:  PA and lateral chest x-ray of January 13, 2012.  FINDINGS: The lungs are adequately inflated and clear. The cardiac silhouette is normal in size. The pulmonary vascularity is not engorged. The mediastinum is normal in width. There is no pleural effusion or pneumothorax or pneumomediastinum. The trachea is midline. The observed portions of the bony thorax exhibit no acute abnormalities.  IMPRESSION: There is no evidence of active cardiopulmonary disease.   Electronically Signed   By: David  Swazil[place]   On: 04/15/2013 18:55      EKG Interpretation   Date/Time:  Monday April 15 2013 17:16:01 EDT Ventricular Rate:  88 PR Interval:  139 QRS Duration: 98 QT Interval:  365 QTC Calculation: 442 R Axis:   78 Text Interpretation:  Sinus rhythm RSR' in V1 or V2, right VCD or RVH  Similar to prior Confirmed by [facility]  MD, BLAIR ([id number]) on 04/15/2013 7:01:06  PM      MDM   Final diagnoses:  None    1. Chest pain  Pain is unchanged in ED including post-nitroglycerin use. EKG non-acute, labs, including delta troponin negative. Patient's vital signs are stable. Discussed patient care with Dr. Gwendolyn GrantWalden. Stable for discharge.    Arnoldo HookerShari A Arden Axon, PA-C 04/15/13 2243

## 2013-04-15 NOTE — ED Notes (Addendum)
Pt c/o chest pressure and SOB that started around 1200. Recently started on Imitrex. Denies dizziness, blurred vision, n/v. Pt states she feels very flushed.

## 2013-04-26 IMAGING — CR DG CHEST 2V
2 series · 2 of 2 positions shown · non-contrast
Comparison: 12/24/2010.

CLINICAL DATA: Cough, chest congestion, fever, shortness of breath,
myalgias.

CHEST - 2 VIEW

[w chest pa]
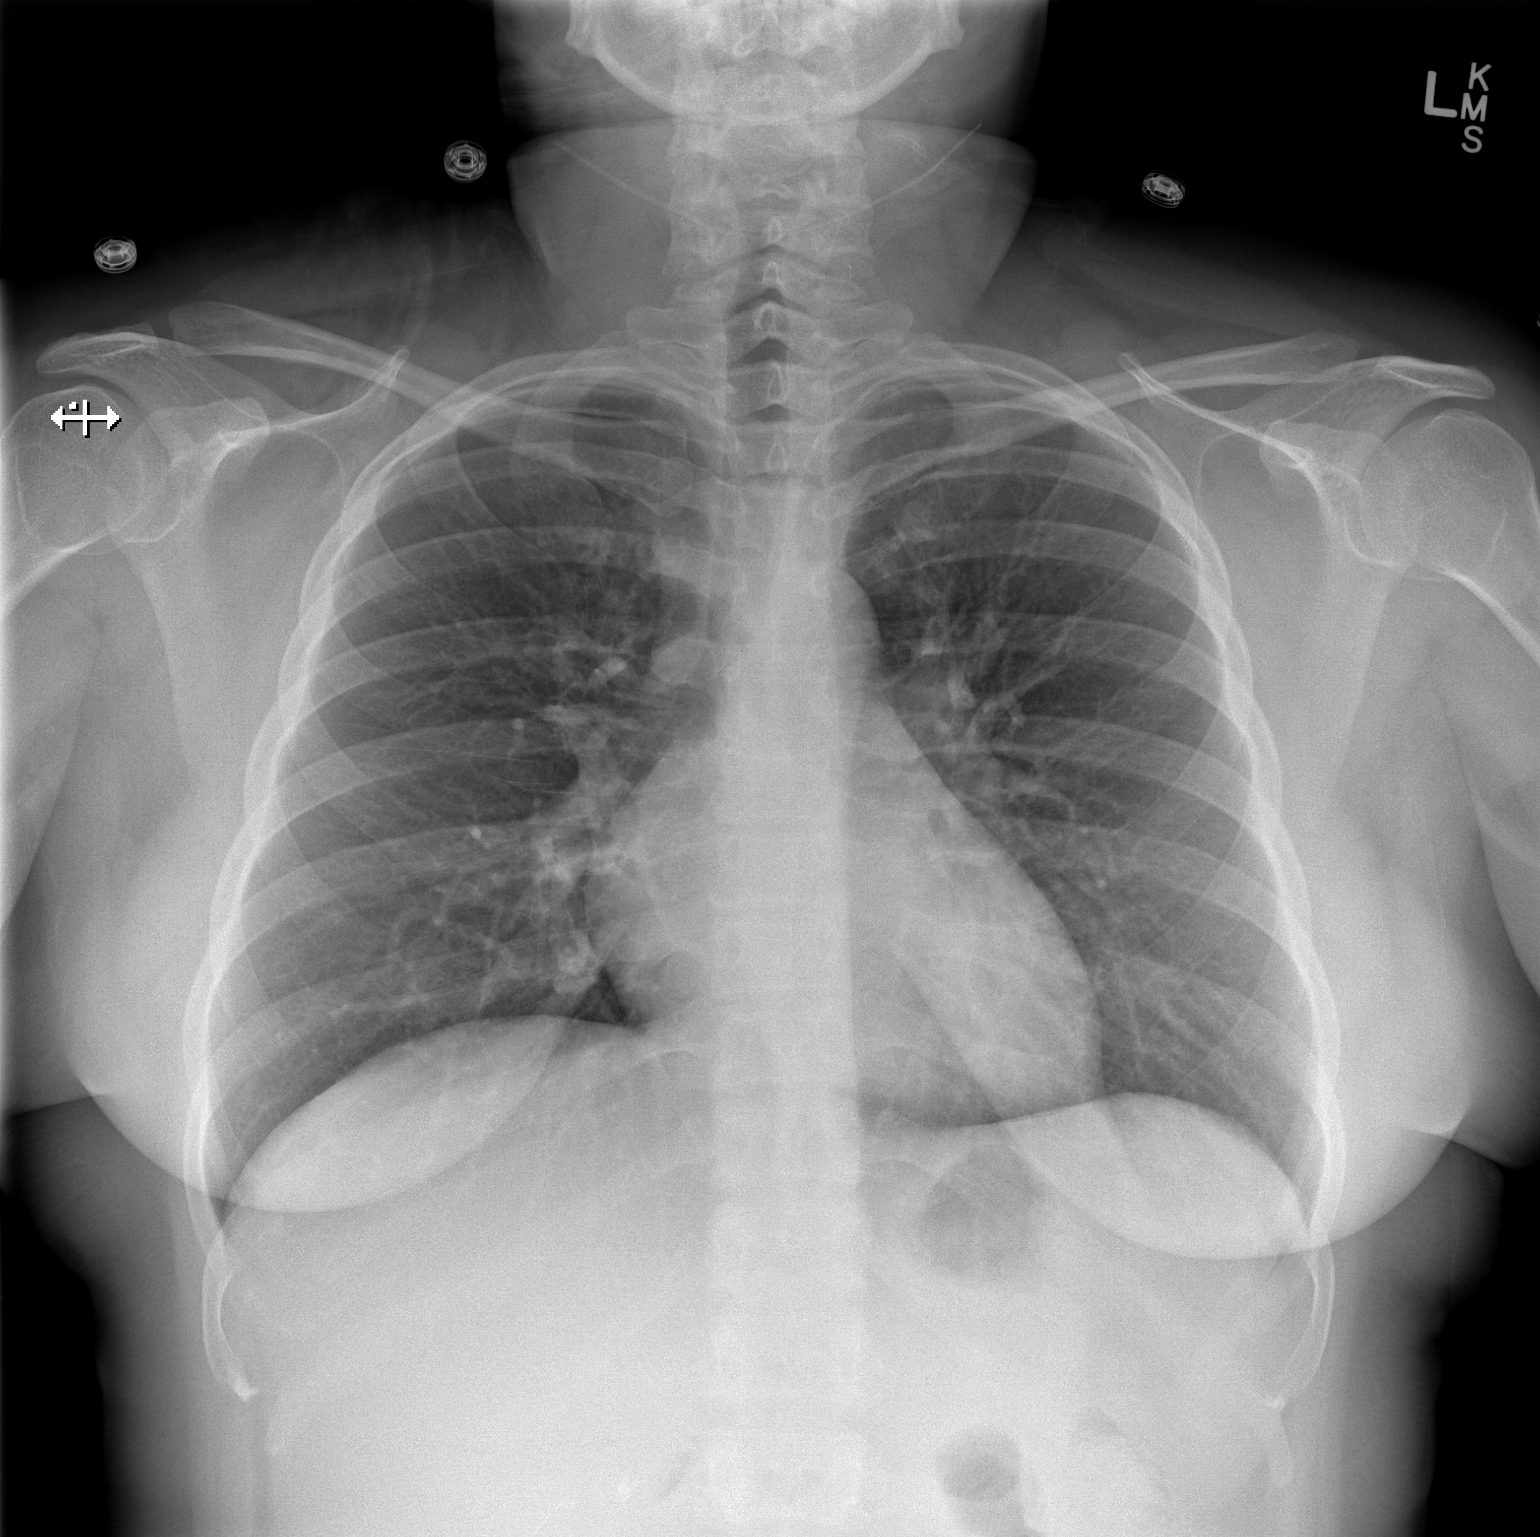

[w chest lat]
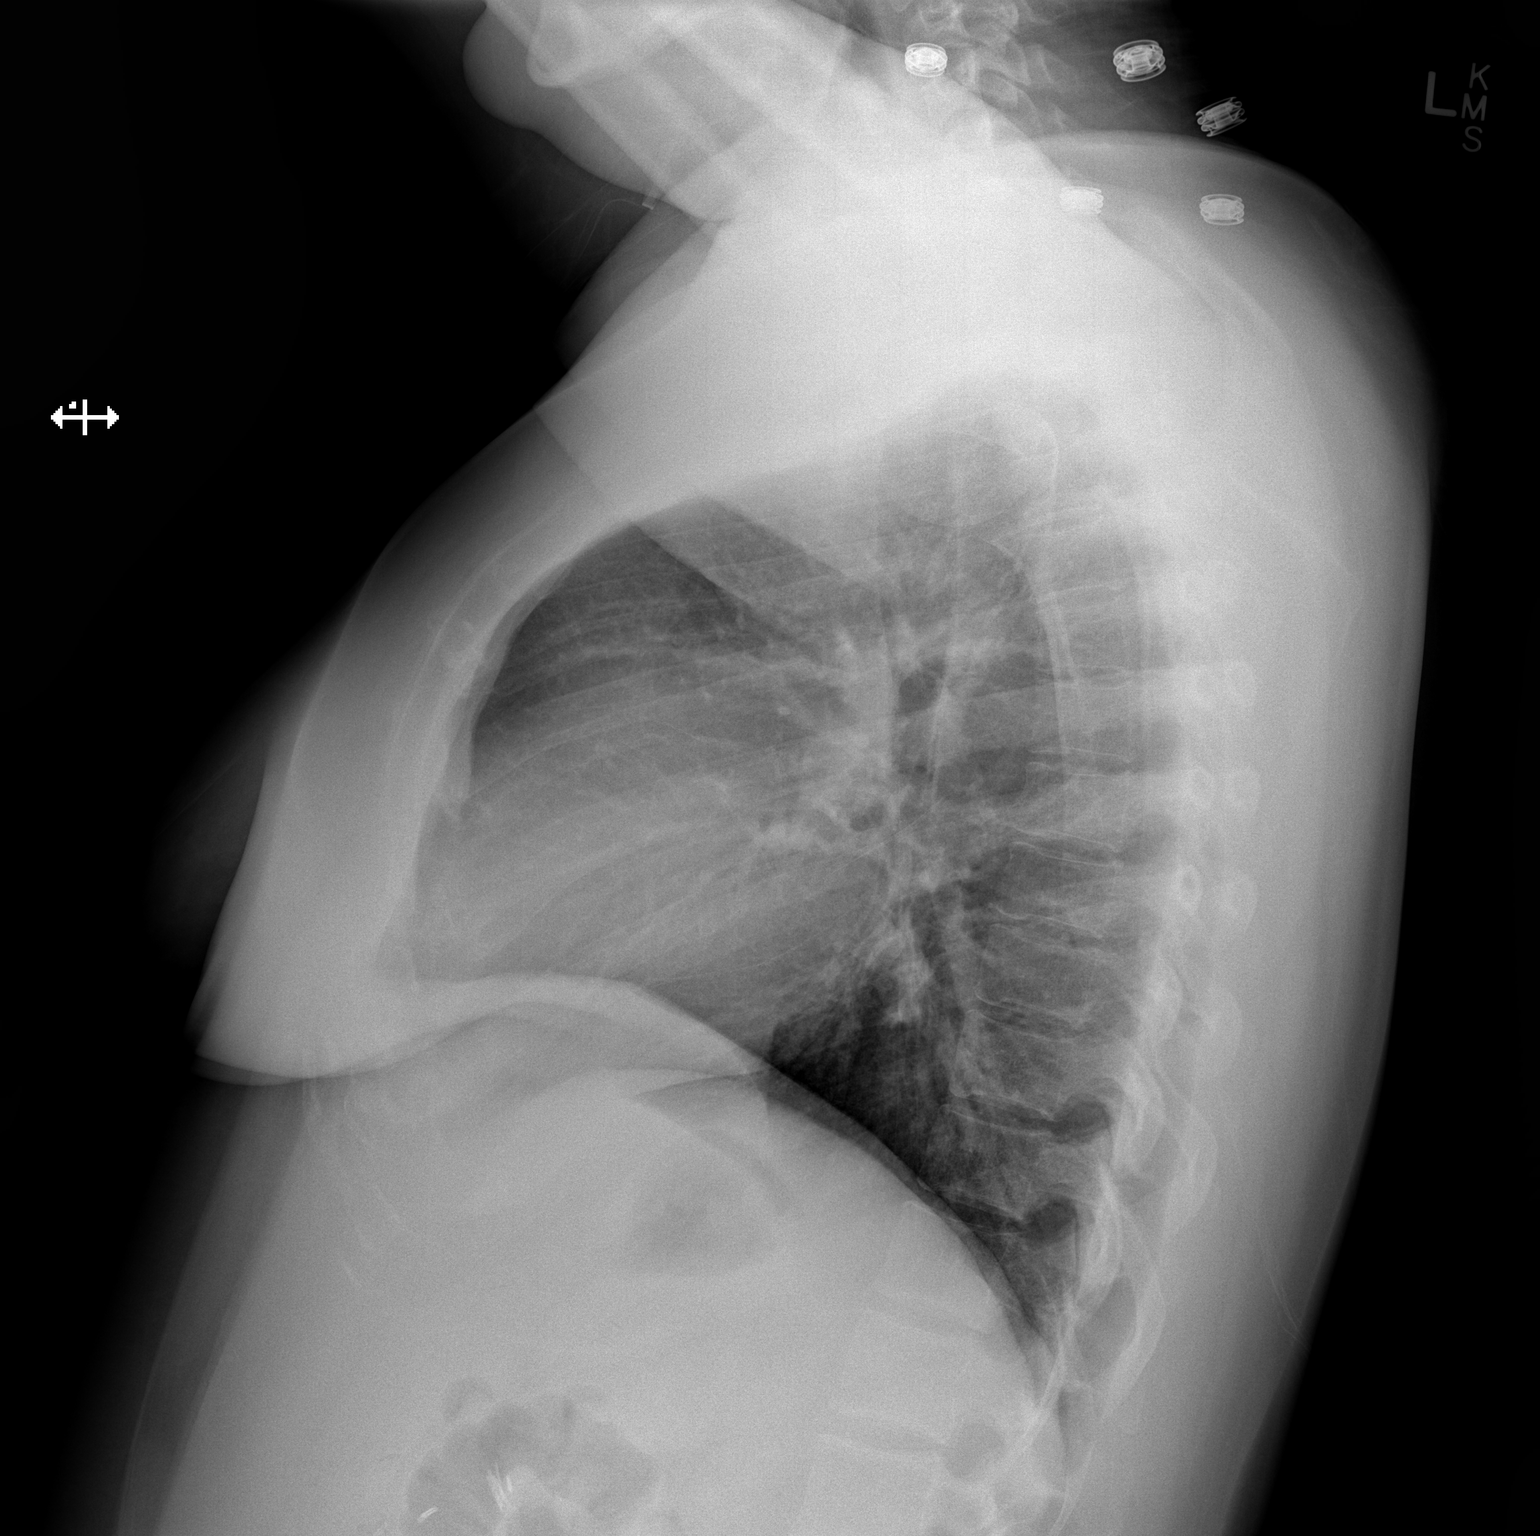

[2 of 2 positions shown; findings below may reference images not displayed]

FINDINGS: Normal sized heart.  Clear lungs.  Normal appearing
bones.  Cholecystectomy clips.
IMPRESSION: No acute abnormality.

## 2013-08-08 ENCOUNTER — Encounter (HOSPITAL_COMMUNITY): Payer: Self-pay | Admitting: Pharmacy Technician

## 2013-08-13 ENCOUNTER — Ambulatory Visit (HOSPITAL_COMMUNITY)
Admission: RE | Admit: 2013-08-13 | Discharge: 2013-08-13 | Disposition: A | Discharge status: Home / Self Care | Payer: BC Managed Care – HMO | Source: Ambulatory Visit | Attending: Cardiology | Admitting: Cardiology

## 2013-08-13 ENCOUNTER — Encounter (HOSPITAL_COMMUNITY): Payer: Self-pay

## 2013-08-13 ENCOUNTER — Encounter (HOSPITAL_COMMUNITY)
Admission: RE | Disposition: A | Discharge status: Home / Self Care | Payer: Self-pay | Source: Ambulatory Visit | Attending: Cardiology

## 2013-08-13 DIAGNOSIS — Q2111 Secundum atrial septal defect: Secondary | ICD-10-CM | POA: Insufficient documentation

## 2013-08-13 DIAGNOSIS — Q211 Atrial septal defect: Secondary | ICD-10-CM | POA: Insufficient documentation

## 2013-08-13 HISTORY — PX: TEE WITHOUT CARDIOVERSION: SHX5443

## 2013-08-13 LAB — GLUCOSE, CAPILLARY: Glucose-Capillary: 107 mg/dL — ABNORMAL HIGH (ref 70–99)

## 2013-08-13 SURGERY — ECHOCARDIOGRAM, TRANSESOPHAGEAL
Anesthesia: Moderate Sedation

## 2013-08-13 MED ORDER — FENTANYL CITRATE 0.05 MG/ML IJ SOLN
INTRAMUSCULAR | Status: AC
  Filled 2013-08-13: qty 2

## 2013-08-13 MED ORDER — MIDAZOLAM HCL 5 MG/ML IJ SOLN
INTRAMUSCULAR | Status: AC
  Filled 2013-08-13: qty 1

## 2013-08-13 MED ORDER — MIDAZOLAM HCL 10 MG/2ML IJ SOLN
INTRAMUSCULAR | Status: DC | PRN
  Administered 2013-08-13 (×2): 2 mg via INTRAVENOUS

## 2013-08-13 MED ORDER — BUTAMBEN-TETRACAINE-BENZOCAINE 2-2-14 % EX AERO
INHALATION_SPRAY | CUTANEOUS | Status: DC | PRN
  Administered 2013-08-13: 2 via TOPICAL

## 2013-08-13 MED ORDER — SODIUM CHLORIDE 0.9 % IV SOLN: INTRAVENOUS | Status: DC

## 2013-08-13 MED ORDER — FENTANYL CITRATE 0.05 MG/ML IJ SOLN
INTRAMUSCULAR | Status: DC | PRN
  Administered 2013-08-13: 25 ug via INTRAVENOUS
  Administered 2013-08-13: 50 ug via INTRAVENOUS

## 2013-08-13 NOTE — CV Procedure (Addendum)
TEE: Under moderate sedation, TEE was performed without complications: LV: Normal. Normal EF. RV: Normal LA: Normal. Left atrial appendage: Normal without thrombus. Normal function. Inter atrial septum is intact without obvious defect. Double contrast study was very weakly positive for right to left shunting with strain for atrial level shunting. No late appearance of bubbles to suggest intra pulmonary shunting  . RA: Normal  MV: Normal Trace MR. TV: Normal Trace TR AV: Normal. No AI or AS. PV: Normal. Trace PI.  Thoracic and ascending aorta: Normal without significant plaque or atheromatous changes.

## 2013-08-13 NOTE — H&P (Signed)
  Please see office visit notes for complete details of HPI.  

## 2013-08-13 NOTE — Discharge Instructions (Signed)
Conscious Sedation, Adult, Care After °Refer to this sheet in the next few weeks. These instructions provide you with information on caring for yourself after your procedure. Your health care provider may also give you more specific instructions. Your treatment has been planned according to current medical practices, but problems sometimes occur. Call your health care provider if you have any problems or questions after your procedure. °WHAT TO EXPECT AFTER THE PROCEDURE  °After your procedure: °· You may feel sleepy, clumsy, and have poor balance for several hours. °· Vomiting may occur if you eat too soon after the procedure. °HOME CARE INSTRUCTIONS °· Do not participate in any activities where you could become injured for at least 24 hours. Do not: °¨ Drive. °¨ Swim. °¨ Ride a bicycle. °¨ Operate heavy machinery. °¨ Cook. °¨ Use power tools. °¨ Climb ladders. °¨ Work from a high place. °· Do not make important decisions or sign legal documents until you are improved. °· If you vomit, drink water, juice, or soup when you can drink without vomiting. Make sure you have little or no nausea before eating solid foods. °· Only take over-the-counter or prescription medicines for pain, discomfort, or fever as directed by your health care provider. °· Make sure you and your family fully understand everything about the medicines given to you, including what side effects may occur. °· You should not drink alcohol, take sleeping pills, or take medicines that cause drowsiness for at least 24 hours. °· If you smoke, do not smoke without supervision. °· If you are feeling better, you may resume normal activities 24 hours after you were sedated. °· Keep all appointments with your health care provider. °SEEK MEDICAL CARE IF: °· Your skin is pale or bluish in color. °· You continue to feel nauseous or vomit. °· Your pain is getting worse and is not helped by medicine. °· You have bleeding or swelling. °· You are still sleepy or  feeling clumsy after 24 hours. °SEEK IMMEDIATE MEDICAL CARE IF: °· You develop a rash. °· You have difficulty breathing. °· You develop any type of allergic problem. °· You have a fever. °MAKE SURE YOU: °· Understand these instructions. °· Will watch your condition. °· Will get help right away if you are not doing well or get worse. °Document Released: 10/17/2012 Document Reviewed: 10/17/2012 °ExitCare® Patient Information ©2015 [employer]. This information is not intended to replace advice given to you by your health care provider. Make sure you discuss any questions you have with your health care provider. °Transesophageal Echocardiogram °Transesophageal echocardiography (TEE) is a special type of test that produces images of the heart by using sound waves (echocardiogram). This type of echocardiography can obtain better images of the heart than standard echocardiography. TEE is done by passing a flexible tube down the esophagus. The heart is located in front of the esophagus. Because the heart and esophagus are close to one another, your health care provider can take very clear, detailed pictures of the heart via ultrasound waves. °TEE may be done: °· If your health care provider needs more information based on standard echocardiography findings. °· If you had a stroke. This might have happened because a clot formed in your heart. TEE can visualize different areas of the heart and check for clots. °· To check valve anatomy and function. °· To check for infection on the inside of your heart (endocarditis). °· To evaluate the dividing wall (septum) of the heart and presence of a hole that did   not close after birth (patent foramen ovale or atrial septal defect). °· To help diagnose a tear in the wall of the aorta (aortic dissection). °· During cardiac valve surgery. This allows the surgeon to assess the valve repair before closing the chest. °· During a variety of other cardiac procedures to guide positioning of  catheters. °· Sometimes before a cardioversion, which is a shock to convert heart rhythm back to normal. °LET YOUR HEALTH CARE PROVIDER KNOW ABOUT:  °· Any allergies you have. °· All medicines you are taking, including vitamins, herbs, eye drops, creams, and over-the-counter medicines. °· Previous problems you or members of your family have had with the use of anesthetics. °· Any blood disorders you have. °· Previous surgeries you have had. °· Medical conditions you have. °· Swallowing difficulties. °· An esophageal obstruction. °RISKS AND COMPLICATIONS  °Generally, TEE is a safe procedure. However, as with any procedure, complications can occur. Possible complications include an esophageal tear (rupture). °BEFORE THE PROCEDURE  °· Do not eat or drink for 6 hours before the procedure or as directed by your health care provider. °· Arrange for someone to drive you home after the procedure. Do not drive yourself home. During the procedure, you will be given medicines that can continue to make you feel drowsy and can impair your reflexes. °· An IV access tube will be started in the arm. °PROCEDURE  °· A medicine to help you relax (sedative) will be given through the IV access tube. °· A medicine may be sprayed or gargled to numb the back of the throat. °· Your blood pressure, heart rate, and breathing (vital signs) will be monitored during the procedure. °· The TEE probe is a long, flexible tube. The tip of the probe is placed into the back of the mouth, and you will be asked to swallow. This helps to pass the tip of the probe into the esophagus. Once the tip of the probe is in the correct area, your health care provider can take pictures of the heart. °· TEE is usually not a painful procedure. You may feel the probe press against the back of the throat. The probe does not enter the trachea and does not affect your breathing. °AFTER THE PROCEDURE  °· You will be in bed, resting, until you have fully returned to  consciousness. °· When you first awaken, your throat may feel slightly sore and will probably still feel numb. This will improve slowly over time. °· You will not be allowed to eat or drink until it is clear that the numbness has improved. °· Once you have been able to drink, urinate, and sit on the edge of the bed without feeling sick to your stomach (nausea) or dizzy, you may be cleared to go home. °· You should have a friend or family member with you for the next 24 hours after your procedure. °Document Released: 03/19/2002 Document Revised: 01/01/2013 Document Reviewed: 06/28/2012 °ExitCare® Patient Information ©2015 [employer]. This information is not intended to replace advice given to you by your health care provider. Make sure you discuss any questions you have with your health care provider. ° °

## 2013-08-13 NOTE — Progress Notes (Signed)
  Echocardiogram Echocardiogram Transesophageal has been performed.  Kristen Klein FRANCES 08/13/2013, 10:41 AM

## 2013-08-13 NOTE — CV Procedure (Signed)
TEE: Under moderate sedation, TEE was performed without complications: LV: Normal. Normal EF. RV: Normal  LA: Normal. Left atrial appendage: Normal without thrombus. Normal function. Inter atrial septum showed presence of a small PFO with Double contrast study mildly positive  for atrial level shunting. No late appearance of bubbles.  RA: Normal  MV: Normal Trace MR. TV: Normal Trace TR AV: Normal. No AI or AS. PV: Normal. Trace PI.  Thoracic and ascending aorta: Normal without significant plaque or atheromatous changes.

## 2013-08-13 NOTE — Interval H&P Note (Signed)
History and Physical Interval Note:  08/13/2013 10:17 AM  Kristen Klein  has presented today for surgery, with the diagnosis of ASD   The various methods of treatment have been discussed with the patient and family. After consideration of risks, benefits and other options for treatment, the patient has consented to  Procedure(s): TRANSESOPHAGEAL ECHOCARDIOGRAM (TEE) (N/A) as a surgical intervention .  The patient's history has been reviewed, patient examined, no change in status, stable for surgery.  I have reviewed the patient's chart and labs.  Questions were answered to the patient's satisfaction.     Pamella PertGANJI,JAGADEESH R

## 2013-08-14 ENCOUNTER — Encounter (HOSPITAL_COMMUNITY): Payer: Self-pay | Admitting: Cardiology

## 2013-09-12 ENCOUNTER — Ambulatory Visit (INDEPENDENT_AMBULATORY_CARE_PROVIDER_SITE_OTHER): Payer: BC Managed Care – PPO | Admitting: Family Medicine

## 2013-09-12 VITALS — BP 118/76 | HR 82 | Temp 97.9°F | Resp 16 | Ht 61.5 in | Wt 182.0 lb

## 2013-09-12 DIAGNOSIS — J0100 Acute maxillary sinusitis, unspecified: Secondary | ICD-10-CM

## 2013-09-12 DIAGNOSIS — J069 Acute upper respiratory infection, unspecified: Secondary | ICD-10-CM

## 2013-09-12 MED ORDER — AMOXICILLIN-POT CLAVULANATE 875-125 MG PO TABS: 1.0000 | ORAL_TABLET | Freq: Two times a day (BID) | ORAL | Status: DC

## 2013-09-12 NOTE — Patient Instructions (Signed)
Saline nasal spray atleast 4 times per day, over the counter mucinex or mucinex DM for cough as needed, drink plenty of fluids.  If not improving in next 2 days - can start Augmentin for possible early sinus infection.  Return to the clinic or go to the nearest emergency room if any of your symptoms worsen or new symptoms occur.

## 2013-09-12 NOTE — Progress Notes (Signed)
Subjective:    Patient ID: Kristen Klein, female    DOB: July 29, 1985, 28 y.o.   MRN: [id number]  This chart was scribed for Meredith Staggers, MD, by Tonye Royalty, ED scribe, at [facility]. The patient's care was started at 8:47 AM.   Chief Complaint  Patient presents with  . URI    for the past week pt has been feeling bad. She has a cough now as well     HPI HPI Comments: Kristen Klein is a 28 y.o. female who presents to the [facility] complaining of rhinorrhea, congestion, cough, and sore throat with onset 6 days ago. She states that her nasal discharge has been yellow. She reports a fever measured at 101 for 2 days, with onset 4 days ago, but has not had one for the past 2 days. She states that the congestion has worsened in the past 3 days. She reports a shooting pain up her right nose and cheek and headache with onset last night and states her cough increased last night. She reports taking Mucinex, Tylenol Cold, and Nyquil without remission of symptoms. She denies being around anyone sick. She states her greatest complaint right now is her headache. She states that she began to feel better but symptoms worsened in the past few days.    Patient Active Problem List   Diagnosis Date Noted  . Sterilization 12/09/2011  . Difficult intubation 12/09/2011   Past Medical History  Diagnosis Date  . Hypertension   . Thyroid disease     hyperthyroid  . PFO (patent foramen ovale)   . Depression   . Headache(784.0)   . Sterilization 12/09/2011  . Difficult intubation 12/09/2011  . Anxiety   . Diabetes mellitus without complication    Past Surgical History  Procedure Laterality Date  . Wisdom tooth extraction  2010  . Dilation and evacuation  2009  . Cholecystectomy open  2011  . Cryotherapy    . Dilitation & currettage/hystroscopy with essure  12/09/2011    Procedure: DILATATION & CURETTAGE/HYSTEROSCOPY WITH ESSURE;  Surgeon: Robley Fries, MD;  Location: [facility];  Service: Gynecology;  Laterality: N/A;  . Tee without cardioversion N/A 08/13/2013    Procedure: TRANSESOPHAGEAL ECHOCARDIOGRAM (TEE);  Surgeon: Pamella Pert, MD;  Location: [facility] ENDOSCOPY;  Service: Cardiovascular;  Laterality: N/A;   No Known Allergies Prior to Admission medications   Medication Sig Start Date End Date Taking? Authorizing Provider  ALPRAZolam Prudy Feeler) 0.5 MG tablet Take 0.5 mg by mouth 2 (two) times daily as needed for anxiety.    Yes Historical Provider, MD  cholecalciferol (VITAMIN D) 1000 UNITS tablet Take 1,000 Units by mouth every morning.    Yes Historical Provider, MD  losartan (COZAAR) 25 MG tablet Take 25 mg by mouth every morning.    Yes Historical Provider, MD  PARoxetine (PAXIL) 40 MG tablet Take 40 mg by mouth every morning.   Yes Historical Provider, MD  propranolol ER (INDERAL LA) 120 MG 24 hr capsule Take 120 mg by mouth daily.   Yes Historical Provider, MD   History   Social History  . Marital Status: Married    Spouse Name: N/A    Number of Children: N/A  . Years of Education: N/A   Occupational History  . Not on file.   Social History Main Topics  . Smoking status: Former Smoker -- 0.15 packs/day for 2 years    Types: Cigarettes  Quit date: 01/11/2004  . Smokeless tobacco: Never Used  . Alcohol Use: 0.0 oz/week    3-5 Glasses of wine per week     Comment: occasionally  . Drug Use: No  . Sexual Activity: Yes    Birth Control/ Protection: Inserts     Comment: married   Other Topics Concern  . Not on file   Social History Narrative  . No narrative on file    Review of Systems  Constitutional: Positive for fever and fatigue.  HENT: Positive for congestion, rhinorrhea and sore throat.   Respiratory: Positive for cough.   Neurological: Positive for headaches.       Objective:   Physical Exam  Vitals reviewed. Constitutional: She is oriented to person, place, and time. She appears well-developed  and well-nourished. No distress.  HENT:  Head: Normocephalic and atraumatic.  Right Ear: Hearing, tympanic membrane, external ear and ear canal normal.  Left Ear: Hearing, tympanic membrane, external ear and ear canal normal.  Nose: Right sinus exhibits maxillary sinus tenderness and frontal sinus tenderness. Left sinus exhibits maxillary sinus tenderness.  Mouth/Throat: Oropharynx is clear and moist. No oropharyngeal exudate.  Eyes: Conjunctivae and EOM are normal. Pupils are equal, round, and reactive to light.  Neck: Normal range of motion. Neck supple.  Cardiovascular: Normal rate, regular rhythm, normal Klein sounds and intact distal pulses.   No murmur heard. Pulmonary/Chest: Effort normal and breath sounds normal. No respiratory distress. She has no wheezes. She has no rhonchi. She has no rales.  Musculoskeletal: Normal range of motion.  Neurological: She is alert and oriented to person, place, and time.  Skin: Skin is warm and dry. No rash noted.  Psychiatric: She has a normal mood and affect. Her behavior is normal.    Filed Vitals:   09/12/13 0835  BP: 118/76  Pulse: 82  Temp: 97.9 F (36.6 C)  TempSrc: Oral  Resp: 16  Height: 5' 1.5" (1.562 m)  Weight: 182 lb (82.555 kg)  SpO2: 96%        Assessment & Plan:   Kristen Klein is a 28 y.o. female Acute upper respiratory infections of unspecified site  Acute maxillary sinusitis, recurrence not specified - Plan: amoxicillin-clavulanate (AUGMENTIN) 875-125 MG per tablet  Suspected viral URI, with possible secondary sickening with early sinusitis. Sx care with saline NS, mucinex if needed, Augmentin in next 1-2 days if not improving.  rtc precautions given.   Meds ordered this encounter  Medications  . amoxicillin-clavulanate (AUGMENTIN) 875-125 MG per tablet    Sig: Take 1 tablet by mouth 2 (two) times daily.    Dispense:  20 tablet    Refill:  0   Patient Instructions  Saline nasal spray atleast 4 times per  day, over the counter mucinex or mucinex DM for cough as needed, drink plenty of fluids.  If not improving in next 2 days - can start Augmentin for possible early sinus infection.  Return to the clinic or go to the nearest emergency room if any of your symptoms worsen or new symptoms occur.    I personally performed the services described in this documentation, which was scribed in my presence. The recorded information has been reviewed and considered, and addended by me as needed.

## 2013-10-02 ENCOUNTER — Encounter (HOSPITAL_BASED_OUTPATIENT_CLINIC_OR_DEPARTMENT_OTHER): Payer: Self-pay | Admitting: Emergency Medicine

## 2013-10-02 ENCOUNTER — Emergency Department (HOSPITAL_BASED_OUTPATIENT_CLINIC_OR_DEPARTMENT_OTHER)
Admission: EM | Admit: 2013-10-02 | Discharge: 2013-10-03 | Disposition: A | Discharge status: Home / Self Care | Payer: BC Managed Care – PPO | Attending: Emergency Medicine | Admitting: Emergency Medicine

## 2013-10-02 DIAGNOSIS — I10 Essential (primary) hypertension: Secondary | ICD-10-CM | POA: Diagnosis not present

## 2013-10-02 DIAGNOSIS — Z792 Long term (current) use of antibiotics: Secondary | ICD-10-CM | POA: Diagnosis not present

## 2013-10-02 DIAGNOSIS — F3289 Other specified depressive episodes: Secondary | ICD-10-CM | POA: Insufficient documentation

## 2013-10-02 DIAGNOSIS — Z79899 Other long term (current) drug therapy: Secondary | ICD-10-CM | POA: Diagnosis not present

## 2013-10-02 DIAGNOSIS — Z87891 Personal history of nicotine dependence: Secondary | ICD-10-CM | POA: Diagnosis not present

## 2013-10-02 DIAGNOSIS — F411 Generalized anxiety disorder: Secondary | ICD-10-CM | POA: Insufficient documentation

## 2013-10-02 DIAGNOSIS — F329 Major depressive disorder, single episode, unspecified: Secondary | ICD-10-CM | POA: Insufficient documentation

## 2013-10-02 DIAGNOSIS — G43109 Migraine with aura, not intractable, without status migrainosus: Secondary | ICD-10-CM | POA: Diagnosis not present

## 2013-10-02 DIAGNOSIS — E119 Type 2 diabetes mellitus without complications: Secondary | ICD-10-CM | POA: Diagnosis not present

## 2013-10-02 DIAGNOSIS — G43019 Migraine without aura, intractable, without status migrainosus: Secondary | ICD-10-CM

## 2013-10-02 DIAGNOSIS — G43909 Migraine, unspecified, not intractable, without status migrainosus: Secondary | ICD-10-CM | POA: Diagnosis present

## 2013-10-02 HISTORY — DX: Migraine, unspecified, not intractable, without status migrainosus: G43.909

## 2013-10-02 MED ORDER — SODIUM CHLORIDE 0.9 % IV BOLUS (SEPSIS)
1000.0000 mL | Freq: Once | INTRAVENOUS | Status: AC
  Administered 2013-10-02: 1000 mL via INTRAVENOUS

## 2013-10-02 MED ORDER — DIPHENHYDRAMINE HCL 50 MG/ML IJ SOLN
25.0000 mg | Freq: Once | INTRAMUSCULAR | Status: AC
  Administered 2013-10-02: 25 mg via INTRAVENOUS
  Filled 2013-10-02: qty 1

## 2013-10-02 MED ORDER — METOCLOPRAMIDE HCL 5 MG/ML IJ SOLN
10.0000 mg | Freq: Once | INTRAMUSCULAR | Status: AC
  Administered 2013-10-02: 10 mg via INTRAVENOUS
  Filled 2013-10-02: qty 2

## 2013-10-02 MED ORDER — KETOROLAC TROMETHAMINE 15 MG/ML IJ SOLN
15.0000 mg | Freq: Once | INTRAMUSCULAR | Status: AC
  Administered 2013-10-02: 15 mg via INTRAVENOUS
  Filled 2013-10-02: qty 1

## 2013-10-02 NOTE — ED Notes (Signed)
Pt c/o " migraine " x 1 day tramadol at home with out relief

## 2013-10-02 NOTE — ED Provider Notes (Signed)
CSN: [id number]     Arrival date & time 10/02/13  2115 History  This chart was scribed for Hanley Seamen, MD by Luisa Dago, ED Scribe. This patient was seen in room MH05/MH05 and the patient's care was started at 11:36 PM.     Chief Complaint  Patient presents with  . Migraine   The history is provided by the patient. No language interpreter was used.  HPI Comments: Kristen Klein is a 28 y.o. female with a hx  of migraines presents to the Emergency Department complaining of a gradual onset, now severe, migraine that started 1 week ago but worsened today. Pt states that the pain is localized to the left side of her head, as is usual for her migraines. She describes the associated pain as "throbbing" in nature. Pt also endorses associated nausea and photophobia. Denies any fever, chills, emesis, diaphoresis, congestion, cough, weakness, numbness, or SOB. She has taken Goody Powders without relief. She does not tolerate triptans.   Past Medical History  Diagnosis Date  . Hypertension   . Thyroid disease     hyperthyroid  . PFO (patent foramen ovale)   . Depression   . Headache(784.0)   . Sterilization 12/09/2011  . Difficult intubation 12/09/2011  . Anxiety   . Diabetes mellitus without complication   . Migraine    Past Surgical History  Procedure Laterality Date  . Wisdom tooth extraction  2010  . Dilation and evacuation  2009  . Cholecystectomy open  2011  . Cryotherapy    . Dilitation & currettage/hystroscopy with essure  12/09/2011    Procedure: DILATATION & CURETTAGE/HYSTEROSCOPY WITH ESSURE;  Surgeon: Robley Fries, MD;  Location: [facility];  Service: Gynecology;  Laterality: N/A;  . Tee without cardioversion N/A 08/13/2013    Procedure: TRANSESOPHAGEAL ECHOCARDIOGRAM (TEE);  Surgeon: Pamella Pert, MD;  Location: [facility] ENDOSCOPY;  Service: Cardiovascular;  Laterality: N/A;   Family History  Problem Relation Age of Onset  . Hypertension Mother   . Hypothyroidism  Mother   . Hypertension Father   . Alcohol abuse Sister   . Asthma Son   . Allergies Son   . Diabetes Paternal Grandmother    History  Substance Use Topics  . Smoking status: Former Smoker -- 0.15 packs/day for 2 years    Types: Cigarettes    Quit date: 01/11/2004  . Smokeless tobacco: Never Used  . Alcohol Use: 0.0 oz/week    3-5 Glasses of wine per week     Comment: occasionally   OB History   Grav Para Term Preterm Abortions TAB SAB Ect Mult Living   0 1 0 0 2     Review of Systems A complete 10 system review of systems was obtained and all systems are negative except as noted in the HPI and PMH.     Allergies  Review of patient's allergies indicates no known allergies.  Home Medications   Prior to Admission medications   Medication Sig Start Date End Date Taking? Authorizing Provider  ALPRAZolam Prudy Feeler) 0.5 MG tablet Take 0.5 mg by mouth 2 (two) times daily as needed for anxiety.     Historical Provider, MD  amoxicillin-clavulanate (AUGMENTIN) 875-125 MG per tablet Take 1 tablet by mouth 2 (two) times daily. 09/12/13   Shade Flood, MD  cholecalciferol (VITAMIN D) 1000 UNITS tablet Take 1,000 Units by mouth every morning.     Historical Provider, MD  losartan (COZAAR) 25 MG tablet  Take 25 mg by mouth every morning.     Historical Provider, MD  PARoxetine (PAXIL) 40 MG tablet Take 40 mg by mouth every morning.    Historical Provider, MD  propranolol ER (INDERAL LA) 120 MG 24 hr capsule Take 120 mg by mouth daily.    Historical Provider, MD   Triage vitals:BP 148/99  Pulse 108  Temp(Src) 98.4 F (36.9 C)  Resp 16  Ht  (1.549 m)  Wt 180 lb (81.647 kg)  BMI 34.03 kg/m2  SpO2 100%  LMP 09/01/2013  Physical Exam  Nursing note and vitals reviewed. General: Well-developed, well-nourished female in no acute distress; appearance consistent with age of record HENT: normocephalic; atraumatic Eyes: pupils equal, round and reactive to light; extraocular  muscles intact; photophobia Neck: supple Heart: regular rate and rhythm; no murmurs Lungs: clear to auscultation bilaterally Abdomen: soft; nondistended; nontender; no masses or hepatosplenomegaly; bowel sounds present Extremities: No deformity; full range of motion; pulses normal Neurologic: Awake, alert and oriented; motor function intact in all extremities and symmetric; no facial droop; normal coordination and speech Skin: Warm and dry Psychiatric: Normal mood and affect  ED Course  Procedures (including critical care time)  DIAGNOSTIC STUDIES: Oxygen Saturation is 100% on RA, normal by my interpretation.    COORDINATION OF CARE: 11:40 PM- Pt advised of plan for treatment and pt agrees.  Medications  sodium chloride 0.9 % bolus 1,000 mL (0 mLs Intravenous Stopped 10/03/13 0041)  diphenhydrAMINE (BENADRYL) injection 25 mg (25 mg Intravenous Given 10/02/13 2357)  metoCLOPramide (REGLAN) injection 10 mg (10 mg Intravenous Given 10/02/13 2358)  ketorolac (TORADOL) 15 MG/ML injection 15 mg (15 mg Intravenous Given 10/02/13 2355)     MDM  1:46 AM Patient significantly improved IV fluids and medications.  I personally performed the services described in this documentation, which was scribed in my presence. The recorded information has been reviewed and is accurate.    Hanley Seamen, MD 10/03/13 [phone]

## 2013-10-03 NOTE — Discharge Instructions (Signed)

## 2013-10-14 ENCOUNTER — Ambulatory Visit (INDEPENDENT_AMBULATORY_CARE_PROVIDER_SITE_OTHER): Payer: BC Managed Care – PPO | Admitting: Family Medicine

## 2013-10-14 ENCOUNTER — Ambulatory Visit: Payer: BC Managed Care – PPO

## 2013-10-14 VITALS — BP 140/90 | HR 96 | Temp 98.3°F | Resp 16 | Ht 61.0 in | Wt 186.0 lb

## 2013-10-14 DIAGNOSIS — S060X0A Concussion without loss of consciousness, initial encounter: Secondary | ICD-10-CM

## 2013-10-14 DIAGNOSIS — G4489 Other headache syndrome: Secondary | ICD-10-CM

## 2013-10-14 DIAGNOSIS — R110 Nausea: Secondary | ICD-10-CM

## 2013-10-14 NOTE — Patient Instructions (Signed)
Concussion  A concussion, or closed-head injury, is a brain injury caused by a direct blow to the head or by a quick and sudden movement (jolt) of the head or neck. Concussions are usually not life-threatening. Even so, the effects of a concussion can be serious. If you have had a concussion before, you are more likely to experience concussion-like symptoms after a direct blow to the head.   CAUSES  · Direct blow to the head, such as from running into another player during a soccer game, being hit in a fight, or hitting your head on a hard surface.  · A jolt of the head or neck that causes the brain to move back and forth inside the skull, such as in a car crash.  SIGNS AND SYMPTOMS  The signs of a concussion can be hard to notice. Early on, they may be missed by you, family members, and health care providers. You may look fine but act or feel differently.  Symptoms are usually temporary, but they may last for days, weeks, or even longer. Some symptoms may appear right away while others may not show up for hours or days. Every head injury is different. Symptoms include:  · Mild to moderate headaches that will not go away.  · A feeling of pressure inside your head.  · Having more trouble than usual:  ¨ Learning or remembering things you have heard.  ¨ Answering questions.  ¨ Paying attention or concentrating.  ¨ Organizing daily tasks.  ¨ Making decisions and solving problems.  · Slowness in thinking, acting or reacting, speaking, or reading.  · Getting lost or being easily confused.  · Feeling tired all the time or lacking energy (fatigued).  · Feeling drowsy.  · Sleep disturbances.  ¨ Sleeping more than usual.  ¨ Sleeping less than usual.  ¨ Trouble falling asleep.  ¨ Trouble sleeping (insomnia).  · Loss of balance or feeling lightheaded or dizzy.  · Nausea or vomiting.  · Numbness or tingling.  · Increased sensitivity to:  ¨ Sounds.  ¨ Lights.  ¨ Distractions.  · Vision problems or eyes that tire  easily.  · Diminished sense of taste or smell.  · Ringing in the ears.  · Mood changes such as feeling sad or anxious.  · Becoming easily irritated or angry for little or no reason.  · Lack of motivation.  · Seeing or hearing things other people do not see or hear (hallucinations).  DIAGNOSIS  Your health care provider can usually diagnose a concussion based on a description of your injury and symptoms. He or she will ask whether you passed out (lost consciousness) and whether you are having trouble remembering events that happened right before and during your injury.  Your evaluation might include:  · A brain scan to look for signs of injury to the brain. Even if the test shows no injury, you may still have a concussion.  · Blood tests to be sure other problems are not present.  TREATMENT  · Concussions are usually treated in an emergency department, in urgent care, or at a clinic. You may need to stay in the hospital overnight for further treatment.  · Tell your health care provider if you are taking any medicines, including prescription medicines, over-the-counter medicines, and natural remedies. Some medicines, such as blood thinners (anticoagulants) and aspirin, may increase the chance of complications. Also tell your health care provider whether you have had alcohol or are taking illegal drugs. This information   may affect treatment.  · Your health care provider will send you home with important instructions to follow.  · How fast you will recover from a concussion depends on many factors. These factors include how severe your concussion is, what part of your brain was injured, your age, and how healthy you were before the concussion.  · Most people with mild injuries recover fully. Recovery can take time. In general, recovery is slower in older persons. Also, persons who have had a concussion in the past or have other medical problems may find that it takes longer to recover from their current injury.  HOME  CARE INSTRUCTIONS  General Instructions  · Carefully follow the directions your health care provider gave you.  · Only take over-the-counter or prescription medicines for pain, discomfort, or fever as directed by your health care provider.  · Take only those medicines that your health care provider has approved.  · Do not drink alcohol until your health care provider says you are well enough to do so. Alcohol and certain other drugs may slow your recovery and can put you at risk of further injury.  · If it is harder than usual to remember things, write them down.  · If you are easily distracted, try to do one thing at a time. For example, do not try to watch TV while fixing dinner.  · Talk with family members or close friends when making important decisions.  · Keep all follow-up appointments. Repeated evaluation of your symptoms is recommended for your recovery.  · Watch your symptoms and tell others to do the same. Complications sometimes occur after a concussion. Older adults with a brain injury may have a higher risk of serious complications, such as a blood clot on the brain.  · Tell your teachers, school nurse, school counselor, coach, athletic trainer, or work manager about your injury, symptoms, and restrictions. Tell them about what you can or cannot do. They should watch for:  ¨ Increased problems with attention or concentration.  ¨ Increased difficulty remembering or learning new information.  ¨ Increased time needed to complete tasks or assignments.  ¨ Increased irritability or decreased ability to cope with stress.  ¨ Increased symptoms.  · Rest. Rest helps the brain to heal. Make sure you:  ¨ Get plenty of sleep at night. Avoid staying up late at night.  ¨ Keep the same bedtime hours on weekends and weekdays.  ¨ Rest during the day. Take daytime naps or rest breaks when you feel tired.  · Limit activities that require a lot of thought or concentration. These include:  ¨ Doing homework or job-related  work.  ¨ Watching TV.  ¨ Working on the computer.  · Avoid any situation where there is potential for another head injury (football, hockey, soccer, basketball, martial arts, downhill snow sports and horseback riding). Your condition will get worse every time you experience a concussion. You should avoid these activities until you are evaluated by the appropriate follow-up health care providers.  Returning To Your Regular Activities  You will need to return to your normal activities slowly, not all at once. You must give your body and brain enough time for recovery.  · Do not return to sports or other athletic activities until your health care provider tells you it is safe to do so.  · Ask your health care provider when you can drive, ride a bicycle, or operate heavy machinery. Your ability to react may be slower after a   brain injury. Never do these activities if you are dizzy.  · Ask your health care provider about when you can return to work or school.  Preventing Another Concussion  It is very important to avoid another brain injury, especially before you have recovered. In rare cases, another injury can lead to permanent brain damage, brain swelling, or death. The risk of this is greatest during the first 7-10 days after a head injury. Avoid injuries by:  · Wearing a seat belt when riding in a car.  · Drinking alcohol only in moderation.  · Wearing a helmet when biking, skiing, skateboarding, skating, or doing similar activities.  · Avoiding activities that could lead to a second concussion, such as contact or recreational sports, until your health care provider says it is okay.  · Taking safety measures in your home.  ¨ Remove clutter and tripping hazards from floors and stairways.  ¨ Use grab bars in bathrooms and handrails by stairs.  ¨ Place non-slip mats on floors and in bathtubs.  ¨ Improve lighting in dim areas.  SEEK MEDICAL CARE IF:  · You have increased problems paying attention or  concentrating.  · You have increased difficulty remembering or learning new information.  · You need more time to complete tasks or assignments than before.  · You have increased irritability or decreased ability to cope with stress.  · You have more symptoms than before.  Seek medical care if you have any of the following symptoms for more than 2 weeks after your injury:  · Lasting (chronic) headaches.  · Dizziness or balance problems.  · Nausea.  · Vision problems.  · Increased sensitivity to noise or light.  · Depression or mood swings.  · Anxiety or irritability.  · Memory problems.  · Difficulty concentrating or paying attention.  · Sleep problems.  · Feeling tired all the time.  SEEK IMMEDIATE MEDICAL CARE IF:  · You have severe or worsening headaches. These may be a sign of a blood clot in the brain.  · You have weakness (even if only in one hand, leg, or part of the face).  · You have numbness.  · You have decreased coordination.  · You vomit repeatedly.  · You have increased sleepiness.  · One pupil is larger than the other.  · You have convulsions.  · You have slurred speech.  · You have increased confusion. This may be a sign of a blood clot in the brain.  · You have increased restlessness, agitation, or irritability.  · You are unable to recognize people or places.  · You have neck pain.  · It is difficult to wake you up.  · You have unusual behavior changes.  · You lose consciousness.  MAKE SURE YOU:  · Understand these instructions.  · Will watch your condition.  · Will get help right away if you are not doing well or get worse.  Document Released: 03/19/2003 Document Revised: 01/01/2013 Document Reviewed: 07/19/2012  [employer]® Patient Information ©2015 [employer]. This information is not intended to replace advice given to you by your health care provider. Make sure you discuss any questions you have with your health care provider.

## 2013-10-14 NOTE — Progress Notes (Signed)
Subjective: 28 year old lady who works for [employer]. She left work a little early today and came on over here. On Saturday in the range she was fixing the hood of her jacket and struck the need for head into the angle of the car door. It caused a wound and bleeding, but also stunned her a little bit.  She did go back to work today, having felt bad yesterday with fatigue and a headache. She does have history of migraines in the past. However a year ago when she had a concussion in it caused her some more symptoms. Menstrual cycles are irregular but she is not pregnant. She did go to work today but didn't feel well. She is nauseated. She came on over here.  Objective: Alert and oriented but does not look like she feels well. Her TMs are normal. Eyes PERRLA. EOMs intact. The small wound on her for hand is healing well. Neck supple without nodes or thyromegaly. No carotid bruits. Motor strength symmetrical. Deep tendon reflexes symmetrical. Tandem walk normal. Romberg negative. Fine motor good.   Assessment: Concussion, Mild Headache Nausea  Plan: Work only a four-hour day the next couple of days return for recheck to decide if she can be returned to regular duty.

## 2013-10-16 ENCOUNTER — Ambulatory Visit (INDEPENDENT_AMBULATORY_CARE_PROVIDER_SITE_OTHER): Payer: BC Managed Care – PPO | Admitting: Family Medicine

## 2013-10-16 VITALS — BP 126/82 | HR 79 | Temp 98.4°F | Resp 16 | Ht 61.25 in | Wt 187.2 lb

## 2013-10-16 DIAGNOSIS — S060X0D Concussion without loss of consciousness, subsequent encounter: Secondary | ICD-10-CM

## 2013-10-16 DIAGNOSIS — S0990XD Unspecified injury of head, subsequent encounter: Secondary | ICD-10-CM

## 2013-10-16 DIAGNOSIS — R51 Headache: Secondary | ICD-10-CM

## 2013-10-16 DIAGNOSIS — R112 Nausea with vomiting, unspecified: Secondary | ICD-10-CM

## 2013-10-16 DIAGNOSIS — N926 Irregular menstruation, unspecified: Secondary | ICD-10-CM

## 2013-10-16 DIAGNOSIS — R519 Headache, unspecified: Secondary | ICD-10-CM

## 2013-10-16 LAB — POCT URINE PREGNANCY: Preg Test, Ur: NEGATIVE

## 2013-10-16 MED ORDER — DICLOFENAC SODIUM 75 MG PO TBEC: 75.0000 mg | DELAYED_RELEASE_TABLET | Freq: Two times a day (BID) | ORAL | Status: DC

## 2013-10-16 MED ORDER — ONDANSETRON 4 MG PO TBDP: 4.0000 mg | ORAL_TABLET | Freq: Three times a day (TID) | ORAL | Status: DC | PRN

## 2013-10-16 NOTE — Progress Notes (Signed)
Subjective: Patient continues to have a headache. She is also nauseated and vomited one time yesterday. She remains alert and oriented. She has been able to work 4 hours a day but still has the headache. No other neurologic changes.  Objective: Alert and oriented. TMs normal. Eyes PERRLA. Fundi benign chest clear. Heart regular without murmurs. No carotid bruits. Rapid alternating movements normal. Finger to nose normal. Romberg negative. Gait normal.  Assessment: Headache Nausea with some vomiting Closed head injury Mild concussion but symptoms seem to persist  Plan: Says she's not pregnant, but I'm getting an hCG because of the nausea. Zofran and diclofenac. Return if in all worse at anytime. Plan to return Sunday or Monday for a recheck. Informed her I will not be here. May need imaging at that time if symptoms persist, or if ever getting worse.

## 2013-10-16 NOTE — Patient Instructions (Signed)
Return at any time if abruptly worse  Take the diclofenac one twice daily with food for pain and inflammation  Take the ondansetron (Zofran) one every 6 or 8 hours if needed for nausea.  Return Sunday or Monday for a recheck.

## 2013-10-17 ENCOUNTER — Ambulatory Visit (INDEPENDENT_AMBULATORY_CARE_PROVIDER_SITE_OTHER): Payer: BC Managed Care – PPO | Admitting: Family Medicine

## 2013-10-17 VITALS — BP 138/100 | HR 76 | Temp 98.6°F | Resp 20 | Ht 61.0 in | Wt 185.8 lb

## 2013-10-17 DIAGNOSIS — S060X0A Concussion without loss of consciousness, initial encounter: Secondary | ICD-10-CM | POA: Insufficient documentation

## 2013-10-17 DIAGNOSIS — S060X0D Concussion without loss of consciousness, subsequent encounter: Secondary | ICD-10-CM

## 2013-10-17 MED ORDER — PROMETHAZINE HCL 12.5 MG PO TABS: 12.5000 mg | ORAL_TABLET | Freq: Four times a day (QID) | ORAL | Status: DC | PRN

## 2013-10-17 MED ORDER — MELATONIN 5 MG PO CAPS: 5.0000 mg | ORAL_CAPSULE | Freq: Every evening | ORAL | Status: DC | PRN

## 2013-10-17 NOTE — Progress Notes (Signed)
Kristen Klein - 28 y.o. female MRN [id number]  Date of birth: 1985-10-24  CC & HPI:  The patient presents for evaluation of: Chief Complaint  Patient presents with  . Headache    followed for concusion, she stated that she cannot concentrate and she has severe headache.      Concussion: Reports injury on 10/12/13 where she struck her head on her car door and caused a small amount of bleeding and onset of headaches, nausea, and other symptoms that have progressively worsened.     Symptoms worsened today after going to work and school.  She has been having some nausea and 3 episodes of non-bloody, non-bilious vomiting today.  Started Diclofenac yesterday with worsening GI symptoms. No melana/hematachezia.  Has been taking Zofran and Phenergan prn.  Reports sleep disturbance.  Headache without vision changes, or blurry vision.  N/V sx better with cognitive/visual rest.  Pertinent Hx: Hosp/Imaging of Head Yes - 2 prior CTs   Headaches/Migraines Yes - migranous  LD, Dyslexia, ADHD No  Depression/anxiety/other  Yes   Family Hx of Psych:   Medications: Reviewed: pertinent for Xanax, Paxil, prior Zofran/Phenergan     SCAT3 SYMPTOM SCORE: Symptom SCORE 10/17/13  Headache 4  Pressure 4  Neck Pain 0  N/V 5  Dizziness 3  Blurred vision 2  Balance Problems 3  Sensitivity to light 2  Sensitivity to noise 2  Slowed down 4  "In a Fog" 4  "Don't feel right" 5  Diff concentrating 5  Diff remembering 3  Fatigue/low energy 5  Confusion 3  Drowsiness 4  Trouble Falling Asleep 5  More emotional 2  Irritability 5  Sadness 1  Nerv/Anxious 3  Total: 74  Worse Physical Activity No  Worse Mental Activity Yes    ROS:  Per HPI.   HISTORY: Past Medical, Surgical, Social, and Family History Reviewed & Updated per EMR.  Pertinent Historical Findings include: Previous hx of Migraines, depression, anxiety on Inderall, Xanax, Paxil. Prior Concussion in 2014 following similar head trauma which  took 1 month to resolve  DATA REVIEWED: Prior normal structural CT on 03/29/13 for prior headache symptoms.  OBJECTIVE FINDINGS:  VS:   HT:5\' 1"  (154.9 cm)   WT:185 lb 12.8 oz (84.278 kg)  BMI:35.2          BP:138/100 mmHg  HR:76bpm  TEMP:98.6 F (37 C)(Oral)  RESP:99 %  PHYSICAL EXAM: GENERAL: Adult  female. Uncomfortable; no respiratory distress   PSYCH: alert and appropriate, good insight   HNEENT: mmm, no JVD  EXTREM: Warm, well perfused.  Moves all 4 extremities spontaneously; no lateralization.   No pretibial edema.  NEURO Exam: Gross Deficits:  No significant gross deficits. Speech:  Fluid and coherent  Gait:  Normal Cerebellar:   2 layers with double leg stance, for errors with tandem stance, 2 errors with single leg stance CN:  2 through 10 intact however there is horizontal and vertical saccades accommodation intact UE Myo/Sen:  upper extremity strength and sensation are 5+5 and intact to light touch LE Myo/Sen:    lower extremity strength and sensation are 5+5 in intact to light touch      ASSESSMENT: 1. Concussion with no loss of consciousness, subsequent encounter    Sx consistent with Concussion with sx exacerbated by over-stimulation at work, home and school.  Has been working, studying and taking care of 2 young children with significant sleep disturbance and underlying anxiety/depression headache disorder. Reflux/GERD sx maybe exacerbated by NSAID so d/c.  Reviewed Red Flags for worsening sx requiring emergent evaluation including worsening N/V with appropriate rest.  PLAN: See problem based charting & AVS for additional documentation. - start melatonin qhs to help regulate sleep. - Out of work and minimize mental activity until re-eval.   > Return in 4 days (on 10/21/2013).  If and persistent or worsening vomiting with appropriate rest will need a CT scan of her head but reassuring given she has had 2 prior normal CTs and no other evidence of significant increased  ICP.  >50% of this 25 minute visit spent in direct patient counseling and/or coordination of care.

## 2013-10-17 NOTE — Patient Instructions (Signed)
Stop the diclofenac.  If you have worsening symptoms try using Tylenol.  Your home work is = CLOSET THERAPY until Monday when we would like to re-evaluate you. Start Melatonin 5mg  30 min before bedtime.  Concussion Direct trauma to the head often causes a condition known as a concussion. This injury can temporarily interfere with brain function and may cause you to pass out (lose consciousness). The consequences of a concussion are usually short-term, but repetitive concussions can be very dangerous. If you have multiple concussions, you will have a greater risk of long-term effects, such as slurred speech, slow movements, impaired thinking, or tremors. The severity of a concussion is based on the length and severity of the interference with brain activity. SYMPTOMS  Symptoms of a concussion vary depending on the severity of the injury. Very mild concussions may even occur without any noticeable symptoms. Swelling in the area of the injury is not related to the seriousness of the injury.   Mild concussion:  Temporary loss of consciousness may or may not occur.  Memory loss (amnesia) for a short time.  Emotional instability.  Confusion.  Severe concussion:  Usually prolonged loss of consciousness.  Confusion  One pupil (the black part in the middle of the eye) is larger than the other.  Changes in vision (including blurring).  Changes in breathing.  Disturbed balance (equilibrium).  Headaches.  Confusion.  Nausea or vomiting.  Slower reaction time than normal.  Difficulty learning and remembering things you have heard. CAUSES  A concussion is the result of trauma to the head. When the head is subjected to such an injury, the brain strikes against the inner wall of the skull. This impact is what causes the damage to the brain. The force of injury is related to severity of injury. The most severe concussions are associated with incidents that involve large impact forces such  as motor vehicle accidents. Wearing a helmet will reduce the severity of trauma to the head, but concussions may still occur if you are wearing a helmet. RISK INCREASES WITH:  Contact sports (football, hockey, soccer, rugby, basketball or lacrosse).  Fighting sports (martial arts or boxing).  Riding bicycles, motorcycles, or horses (when you ride without a helmet). PREVENTION  Wear proper protective headgear and ensure correct fit.  Wear seat belts when driving and riding in a car.  Do not drink or use mind-altering drugs and drive. PROGNOSIS  Concussions are typically curable if they are recognized and treated early. If a severe concussion or multiple concussions go untreated, then the complications may be life-threatening or cause permanent disability and brain damage. RELATED COMPLICATIONS   Permanent brain damage (slurred speech, slow movement, impaired thinking, or tremors).  Bleeding under the skull (subdural hemorrhage or hematoma, epidural hematoma).  Bleeding into the brain.  Prolonged healing time if usual activities are resumed too soon.  Infection if skin over the concussion site is broken.  Increased risk of future concussions (less trauma is required for a second concussion than the first). TREATMENT  Treatment initially requires immediate evaluation to determine the severity of the concussion. Occasionally, a hospital stay may be required for observation and treatment.  Avoid exertion. Bed rest for the first 24-48 hours is recommended.  Return to play is a controversial subject due to the increased risk for future injury as well as permanent disability and should be discussed at length with your treating caregiver. Many factors such as the severity of the concussion and whether this is the first,  second, or third concussion play a role in timing a patient's return to sports.  MEDICATION  Do not give any medicine, including non-prescription acetaminophen or aspirin,  until the diagnosis is certain. These medicines may mask developing symptoms.  SEEK IMMEDIATE MEDICAL CARE IF:   Symptoms get worse or do not improve in 24 hours.  Any of the following symptoms occur:  Vomiting.  The inability to move arms and legs equally well on both sides.  Fever.  Neck stiffness.  Pupils of unequal size, shape, or reactivity.  Convulsions.  Noticeable restlessness.  Severe headache that persists for longer than 4 hours after injury.  Confusion, disorientation, or mental status changes. Document Released: 12/27/2004 Document Revised: 10/17/2012 Document Reviewed: 04/10/2008 [facility] Patient Information 2015 [place], [place]. This information is not intended to replace advice given to you by your health care provider. Make sure you discuss any questions you have with your health care provider.

## 2013-10-29 ENCOUNTER — Other Ambulatory Visit: Payer: Self-pay | Admitting: Dermatology

## 2013-11-01 NOTE — Progress Notes (Signed)
History and physical examinations reviewed; agree with assessment and plan. 

## 2014-01-08 ENCOUNTER — Other Ambulatory Visit: Payer: Self-pay | Admitting: Orthopedic Surgery

## 2014-01-14 ENCOUNTER — Ambulatory Visit (INDEPENDENT_AMBULATORY_CARE_PROVIDER_SITE_OTHER): Payer: BLUE CROSS/BLUE SHIELD | Admitting: Family Medicine

## 2014-01-14 VITALS — BP 124/84 | HR 86 | Temp 97.9°F | Resp 16 | Ht 60.5 in | Wt 191.0 lb

## 2014-01-14 DIAGNOSIS — B349 Viral infection, unspecified: Secondary | ICD-10-CM

## 2014-01-14 DIAGNOSIS — I10 Essential (primary) hypertension: Secondary | ICD-10-CM

## 2014-01-14 DIAGNOSIS — R5383 Other fatigue: Secondary | ICD-10-CM

## 2014-01-14 DIAGNOSIS — G479 Sleep disorder, unspecified: Secondary | ICD-10-CM

## 2014-01-14 DIAGNOSIS — J029 Acute pharyngitis, unspecified: Secondary | ICD-10-CM

## 2014-01-14 LAB — COMPLETE METABOLIC PANEL WITH GFR
Albumin: 4.4 g/dL (ref 3.5–5.2)
BUN: 8 mg/dL (ref 6–23)
CO2: 23 mEq/L (ref 19–32)
Calcium: 9.2 mg/dL (ref 8.4–10.5)
Creat: 0.55 mg/dL (ref 0.50–1.10)
GFR, Est African American: >89 mL/min
GFR, Est Non African American: >89 mL/min
Glucose, Bld: 91 mg/dL (ref 70–99)
Total Bilirubin: 0.7 mg/dL (ref 0.2–1.2)
Total Protein: 7.6 g/dL (ref 6.0–8.3)

## 2014-01-14 LAB — POCT CBC
Granulocyte percent: 56.6 %G (ref 37–80)
HCT, POC: 41.8 % (ref 37.7–47.9)
Hemoglobin: 14.5 g/dL (ref 12.2–16.2)
Lymph, poc: 3.4 (ref 0.6–3.4)
MCH, POC: 31.3 pg — AB (ref 27–31.2)
MCHC: 34.7 g/dL (ref 31.8–35.4)
MCV: 90.1 fL (ref 80–97)
MID (cbc): 0.5 (ref 0–0.9)
MPV: 7.3 fL (ref 0–99.8)
POC Granulocyte: 5 (ref 2–6.9)
POC LYMPH PERCENT: 37.8 %L (ref 10–50)
POC MID %: 5.6 %M (ref 0–12)
Platelet Count, POC: 239 10*3/uL (ref 142–424)
RBC: 4.64 M/uL (ref 4.04–5.48)
RDW, POC: 12.8 %
WBC: 8.9 10*3/uL (ref 4.6–10.2)

## 2014-01-14 LAB — COMPLETE METABOLIC PANEL WITHOUT GFR
ALT: 150 U/L — ABNORMAL HIGH (ref 0–35)
AST: 83 U/L — ABNORMAL HIGH (ref 0–37)
Alkaline Phosphatase: 77 U/L (ref 39–117)
Chloride: 101 meq/L (ref 96–112)
Potassium: 4 meq/L (ref 3.5–5.3)
Sodium: 136 meq/L (ref 135–145)

## 2014-01-14 LAB — POCT GLYCOSYLATED HEMOGLOBIN (HGB A1C): Hemoglobin A1C: 5.6

## 2014-01-14 LAB — TSH: TSH: 2.741 u[IU]/mL (ref 0.350–4.500)

## 2014-01-14 NOTE — Progress Notes (Signed)
 Chief Complaint:  Chief Complaint  Patient presents with  . Illness    laryngitis x 1 month; aches, fatigue, PND, ST x 1 week    HPI: Kristen Klein is a 29 y.o. female who is here for  1 month history of hoarseness and had strep test 2 weeks ago and was negative.  Has had fatigue and can;t get out bed, very tired, she denies SOB or CP. Has not been sleeping well, she ahs been restless, has had msk aches She had recenbt surgery with ganglion cyst 6 days ago Dr Orlan Leavens, she denies n/t Has had strep test with cx which was normal  When this first started she had a sore thraot, runny nose and sneezing, no muscle aches no fever, she did have a cough.  Her kids were sick the first 2 week sin December, she is getting every hour, her body was hurting, she had one episode of diarrhea, she was getting about 11-12 hours and still feels tired.  Denies depression    She sleeps for long periods and feels unrested, has diurnal fatigue, has HA and snores. + HTN.  Has a hx of abnormal thyroid levels x 1, no meds, mom has thyroid issues but does not know which  Wt Readings from Last 3 Encounters:  01/14/14 191 lb (86.637 kg)  10/17/13 185 lb 12.8 oz (84.278 kg)  10/16/13 187 lb 3.2 oz (84.913 kg)     Past Medical History  Diagnosis Date  . Hypertension   . Thyroid disease     hyperthyroid  . PFO (patent foramen ovale)   . Depression   . Headache(784.0)   . Sterilization 12/09/2011  . Difficult intubation 12/09/2011  . Anxiety   . Migraine    Past Surgical History  Procedure Laterality Date  . Wisdom tooth extraction  2010  . Dilation and evacuation  2009  . Cholecystectomy open  2011  . Cryotherapy    . Dilitation & currettage/hystroscopy with essure  12/09/2011    Procedure: DILATATION & CURETTAGE/HYSTEROSCOPY WITH ESSURE;  Surgeon: Robley Fries, MD;  Location: [facility];  Service: Gynecology;  Laterality: N/A;  . Tee without cardioversion N/A 08/13/2013    Procedure:  TRANSESOPHAGEAL ECHOCARDIOGRAM (TEE);  Surgeon: Pamella Pert, MD;  Location: [facility] ENDOSCOPY;  Service: Cardiovascular;  Laterality: N/A;   History   Social History  . Marital Status: Married    Spouse Name: N/A    Number of Children: N/A  . Years of Education: N/A   Social History Main Topics  . Smoking status: Former Smoker -- 0.15 packs/day for 2 years    Types: Cigarettes    Quit date: 01/11/2004  . Smokeless tobacco: Never Used  . Alcohol Use: 0.0 oz/week    3-5 Glasses of wine per week     Comment: occasionally  . Drug Use: No  . Sexual Activity: Yes    Birth Control/ Protection: Inserts     Comment: married   Other Topics Concern  . None   Social History Narrative   Family History  Problem Relation Age of Onset  . Hypertension Mother   . Hypothyroidism Mother   . Hypertension Father   . Alcohol abuse Sister   . Asthma Son   . Allergies Son   . Diabetes Paternal Grandmother    No Known Allergies Prior to Admission medications   Medication Sig Start Date End Date Taking? Authorizing Provider  ALPRAZolam Prudy Feeler) 0.5 MG tablet Take 0.5  mg by mouth 2 (two) times daily as needed for anxiety.    Yes Historical Provider, MD  Ascorbic Acid (VITAMIN C) 1000 MG tablet Take 1,000 mg by mouth daily.   Yes Historical Provider, MD  cholecalciferol (VITAMIN D) 1000 UNITS tablet Take 1,000 Units by mouth every morning.    Yes Historical Provider, MD  HYDROcodone-acetaminophen (NORCO) 10-325 MG per tablet Take 1 tablet by mouth every 6 (six) hours as needed.   Yes Historical Provider, MD  losartan (COZAAR) 25 MG tablet Take 25 mg by mouth every morning.    Yes Historical Provider, MD  ondansetron (ZOFRAN ODT) 4 MG disintegrating tablet Take 1 tablet (4 mg total) by mouth every 8 (eight) hours as needed for nausea or vomiting. 10/16/13  Yes Peyton Najjar, MD  PARoxetine (PAXIL) 40 MG tablet Take 40 mg by mouth every morning.   Yes Historical Provider, MD  promethazine  (PHENERGAN) 12.5 MG tablet Take 1 tablet (12.5 mg total) by mouth every 6 (six) hours as needed for nausea or vomiting. 10/17/13  Yes Andrena Mews, DO  propranolol ER (INDERAL LA) 120 MG 24 hr capsule Take 120 mg by mouth daily.   Yes Historical Provider, MD     ROS: The patient denies fevers, chills, night sweats, unintentional weight loss, chest pain, palpitations, wheezing, dyspnea on exertion, nausea, vomiting, abdominal pain, dysuria, hematuria, melena, numbness,  or tingling.   All other systems have been reviewed and were otherwise negative with the exception of those mentioned in the HPI and as above.    PHYSICAL EXAM: Filed Vitals:   01/14/14 1003  BP: 124/84  Pulse: 86  Temp: 97.9 F (36.6 C)  Resp: 16   Filed Vitals:   01/14/14 1003  Height: 5' 0.5" (1.537 m)  Weight: 191 lb (86.637 kg)   Body mass index is 36.67 kg/(m^2).  General: Alert, no acute distress, obese hispanic female HEENT:  Normocephalic, atraumatic, oropharynx patent. EOMI, PERRLA, tm nl, no swollen glands, no exudates Cardiovascular:  Regular rate and rhythm, no rubs murmurs or gallops.  No Carotid bruits, radial pulse intact. No pedal edema.  Respiratory: Clear to auscultation bilaterally.  No wheezes, rales, or rhonchi.  No cyanosis, no use of accessory musculature GI: No organomegaly, abdomen is soft and non-tender, positive bowel sounds.  No masses. Skin: No rashes. Neurologic: Facial musculature symmetric. Psychiatric: Patient is appropriate throughout our interaction. Lymphatic: No cervical lymphadenopathy Musculoskeletal: Gait intact. 5/5 strength in UE and    LABS: Results for orders placed or performed in visit on 01/14/14  POCT CBC  Result Value Ref Range   WBC 8.9 4.6 - 10.2 K/uL   Lymph, poc 3.4 0.6 - 3.4   POC LYMPH PERCENT 37.8 10 - 50 %L   MID (cbc) 0.5 0 - 0.9   POC MID % 5.6 0 - 12 %M   POC Granulocyte 5.0 2 - 6.9   Granulocyte percent 56.6 37 - 80 %G   RBC 4.64 4.04 -  5.48 M/uL   Hemoglobin 14.5 12.2 - 16.2 g/dL   HCT, POC 81.1 91.4 - 47.9 %   MCV 90.1 80 - 97 fL   MCH, POC 31.3 (A) 27 - 31.2 pg   MCHC 34.7 31.8 - 35.4 g/dL   RDW, POC 78.2 %   Platelet Count, POC 239 142 - 424 K/uL   MPV 7.3 0 - 99.8 fL  POCT glycosylated hemoglobin (Hb A1C)  Result Value Ref Range   Hemoglobin A1C 5.6  EKG/XRAY:   [facility]   ASSESSMENT/PLAN: Encounter Diagnoses  Name Primary?  . Other fatigue Yes  . Sore throat   . Viral illness   . Essential hypertension   . Sleep disturbance    ? Etiology of fatigue for about 1 month after viral like illness Refer to get sleep study, she has signs and sxs of it, denies depression Labs pending: EBV panel, CMP, TSH OTC meds for sore throat, declined any magic mouthwash F/u in 2 weeks  Gross sideeffects, risk and benefits, and alternatives of medications d/w patient. Patient is aware that all medications have potential sideeffects and we are unable to predict every sideeffect or drug-drug interaction that may occur.  ,  PHUONG, DO 01/14/2014 11:28 AM

## 2014-01-15 LAB — EPSTEIN-BARR VIRUS VCA ANTIBODY PANEL
EBV EA IgG: 6.9 U/mL (ref <9.0)
EBV NA IgG: 578 U/mL — ABNORMAL HIGH (ref <18.0)
EBV VCA IgG: 423 U/mL — ABNORMAL HIGH (ref <18.0)
EBV VCA IgM: <10 U/mL (ref <36.0)

## 2014-02-17 ENCOUNTER — Institutional Professional Consult (permissible substitution): Payer: BLUE CROSS/BLUE SHIELD | Admitting: Neurology

## 2014-02-17 ENCOUNTER — Encounter: Payer: Self-pay | Admitting: Neurology

## 2014-03-26 ENCOUNTER — Emergency Department (HOSPITAL_BASED_OUTPATIENT_CLINIC_OR_DEPARTMENT_OTHER)
Admission: EM | Admit: 2014-03-26 | Discharge: 2014-03-26 | Disposition: A | Discharge status: Home / Self Care | Payer: BLUE CROSS/BLUE SHIELD | Attending: Emergency Medicine | Admitting: Emergency Medicine

## 2014-03-26 ENCOUNTER — Emergency Department (HOSPITAL_BASED_OUTPATIENT_CLINIC_OR_DEPARTMENT_OTHER): Payer: BLUE CROSS/BLUE SHIELD

## 2014-03-26 ENCOUNTER — Encounter (HOSPITAL_BASED_OUTPATIENT_CLINIC_OR_DEPARTMENT_OTHER): Payer: Self-pay | Admitting: *Deleted

## 2014-03-26 DIAGNOSIS — F329 Major depressive disorder, single episode, unspecified: Secondary | ICD-10-CM | POA: Insufficient documentation

## 2014-03-26 DIAGNOSIS — R1012 Left upper quadrant pain: Secondary | ICD-10-CM | POA: Insufficient documentation

## 2014-03-26 DIAGNOSIS — Z3202 Encounter for pregnancy test, result negative: Secondary | ICD-10-CM | POA: Insufficient documentation

## 2014-03-26 DIAGNOSIS — Z87891 Personal history of nicotine dependence: Secondary | ICD-10-CM | POA: Diagnosis not present

## 2014-03-26 DIAGNOSIS — Z79899 Other long term (current) drug therapy: Secondary | ICD-10-CM | POA: Diagnosis not present

## 2014-03-26 DIAGNOSIS — Z8639 Personal history of other endocrine, nutritional and metabolic disease: Secondary | ICD-10-CM | POA: Diagnosis not present

## 2014-03-26 DIAGNOSIS — R109 Unspecified abdominal pain: Secondary | ICD-10-CM

## 2014-03-26 DIAGNOSIS — R0789 Other chest pain: Secondary | ICD-10-CM | POA: Insufficient documentation

## 2014-03-26 DIAGNOSIS — R1032 Left lower quadrant pain: Secondary | ICD-10-CM | POA: Insufficient documentation

## 2014-03-26 DIAGNOSIS — I10 Essential (primary) hypertension: Secondary | ICD-10-CM | POA: Diagnosis not present

## 2014-03-26 DIAGNOSIS — F419 Anxiety disorder, unspecified: Secondary | ICD-10-CM | POA: Insufficient documentation

## 2014-03-26 DIAGNOSIS — Q211 Atrial septal defect: Secondary | ICD-10-CM | POA: Insufficient documentation

## 2014-03-26 LAB — CBC WITH DIFFERENTIAL/PLATELET
Basophils Absolute: 0 10*3/uL (ref 0.0–0.1)
Basophils Relative: 0 % (ref 0–1)
EOS ABS: 0.1 10*3/uL (ref 0.0–0.7)
EOS PCT: 1 % (ref 0–5)
HCT: 41.8 % (ref 36.0–46.0)
Hemoglobin: 14.5 g/dL (ref 12.0–15.0)
LYMPHS ABS: 3.5 10*3/uL (ref 0.7–4.0)
Lymphocytes Relative: 48 % — ABNORMAL HIGH (ref 12–46)
MCH: 30.2 pg (ref 26.0–34.0)
MCHC: 34.7 g/dL (ref 30.0–36.0)
MCV: 87.1 fL (ref 78.0–100.0)
Monocytes Absolute: 0.5 10*3/uL (ref 0.1–1.0)
Monocytes Relative: 7 % (ref 3–12)
Neutro Abs: 3.3 10*3/uL (ref 1.7–7.7)
Neutrophils Relative %: 44 % (ref 43–77)
PLATELETS: 283 10*3/uL (ref 150–400)
RBC: 4.8 MIL/uL (ref 3.87–5.11)
RDW: 12.5 % (ref 11.5–15.5)
WBC: 7.4 10*3/uL (ref 4.0–10.5)

## 2014-03-26 LAB — COMPREHENSIVE METABOLIC PANEL
ALT: 120 U/L — ABNORMAL HIGH (ref 0–35)
AST: 61 U/L — AB (ref 0–37)
Albumin: 4.8 g/dL (ref 3.5–5.2)
Alkaline Phosphatase: 77 U/L (ref 39–117)
Anion gap: 7 (ref 5–15)
BUN: 13 mg/dL (ref 6–23)
CALCIUM: 9.4 mg/dL (ref 8.4–10.5)
CO2: 30 mmol/L (ref 19–32)
CREATININE: 0.77 mg/dL (ref 0.50–1.10)
Chloride: 101 mmol/L (ref 96–112)
GFR calc Af Amer: >90 mL/min (ref >90)
Glucose, Bld: 96 mg/dL (ref 70–99)
POTASSIUM: 3.9 mmol/L (ref 3.5–5.1)
SODIUM: 138 mmol/L (ref 135–145)
TOTAL PROTEIN: 8.2 g/dL (ref 6.0–8.3)
Total Bilirubin: 0.7 mg/dL (ref 0.3–1.2)

## 2014-03-26 LAB — URINALYSIS, ROUTINE W REFLEX MICROSCOPIC
BILIRUBIN URINE: NEGATIVE
Glucose, UA: NEGATIVE mg/dL
KETONES UR: NEGATIVE mg/dL
Leukocytes, UA: NEGATIVE
Nitrite: NEGATIVE
Protein, ur: NEGATIVE mg/dL
Specific Gravity, Urine: 1.024 (ref 1.005–1.030)
UROBILINOGEN UA: 0.2 mg/dL (ref 0.0–1.0)
pH: 6 (ref 5.0–8.0)

## 2014-03-26 LAB — LIPASE, BLOOD: Lipase: 33 U/L (ref 11–59)

## 2014-03-26 LAB — URINE MICROSCOPIC-ADD ON

## 2014-03-26 LAB — PREGNANCY, URINE: Preg Test, Ur: NEGATIVE

## 2014-03-26 MED ORDER — ONDANSETRON 4 MG PO TBDP: 4.0000 mg | ORAL_TABLET | Freq: Three times a day (TID) | ORAL | Status: DC | PRN

## 2014-03-26 MED ORDER — IOHEXOL 300 MG/ML  SOLN
25.0000 mL | Freq: Once | INTRAMUSCULAR | Status: AC | PRN
  Administered 2014-03-26: 25 mL via ORAL

## 2014-03-26 MED ORDER — SODIUM CHLORIDE 0.9 % IV BOLUS (SEPSIS)
1000.0000 mL | Freq: Once | INTRAVENOUS | Status: AC
  Administered 2014-03-26: 1000 mL via INTRAVENOUS

## 2014-03-26 MED ORDER — MORPHINE SULFATE 4 MG/ML IJ SOLN
4.0000 mg | Freq: Once | INTRAMUSCULAR | Status: AC
  Administered 2014-03-26: 4 mg via INTRAVENOUS
  Filled 2014-03-26: qty 1

## 2014-03-26 MED ORDER — OXYCODONE-ACETAMINOPHEN 5-325 MG PO TABS: 1.0000 | ORAL_TABLET | ORAL | Status: DC | PRN

## 2014-03-26 MED ORDER — ONDANSETRON HCL 4 MG/2ML IJ SOLN
4.0000 mg | Freq: Once | INTRAMUSCULAR | Status: AC
  Administered 2014-03-26: 4 mg via INTRAVENOUS
  Filled 2014-03-26: qty 2

## 2014-03-26 MED ORDER — IOHEXOL 300 MG/ML  SOLN
100.0000 mL | Freq: Once | INTRAMUSCULAR | Status: AC | PRN
  Administered 2014-03-26: 100 mL via INTRAVENOUS

## 2014-03-26 MED ORDER — IBUPROFEN 800 MG PO TABS: 800.0000 mg | ORAL_TABLET | Freq: Three times a day (TID) | ORAL | Status: DC | PRN

## 2014-03-26 NOTE — ED Notes (Signed)
Patient asked to change into a gown.  

## 2014-03-26 NOTE — ED Notes (Signed)
Reports 2 days of LUQ/rib pain- worse with certain movements- has tried ibuprofen with no relief, norco (leftover from old Rx) with some relief, GI cocktail with no relief- denies injury

## 2014-03-26 NOTE — ED Provider Notes (Signed)
TIME SEEN: 10:55 AM  CHIEF COMPLAINT: left sided chest and abdominal pain  HPI: Patient is a 29 y.o. F with history of hypertension, hyperthyroidism, anxiety and migraines who presents to the emergency department with 2 days of left-sided rib pain and left-sided abdominal pain. Patient reports her pain is there constantly but is worse with movement and deep inspiration. She tried taking ibuprofen, Norco and a GI cocktail that a nurse practitioner friend gave to her without relief. She has had nausea but no vomiting or diarrhea. No fevers. No cough. No shortness of breath. No prior history of PE or DVT. No recent prolonged immobilization, fracture, surgery, trauma, no exogenous hormone use. No sick contacts or recent travel. No dysuria or hematuria. No vaginal bleeding or discharge. She is status post cholecystectomy.  ROS: See HPI Constitutional: no fever  Eyes: no drainage  ENT: no runny nose   Cardiovascular:  Left chest pain  Resp: no SOB  GI: no vomiting GU: no dysuria Integumentary: no rash  Allergy: no hives  Musculoskeletal: no leg swelling  Neurological: no slurred speech ROS otherwise negative  PAST MEDICAL HISTORY/PAST SURGICAL HISTORY:  Past Medical History  Diagnosis Date  . Hypertension   . Thyroid disease     hyperthyroid  . PFO (patent foramen ovale)   . Depression   . Headache(784.0)   . Sterilization 12/09/2011  . Difficult intubation 12/09/2011  . Anxiety   . Migraine     MEDICATIONS:  Prior to Admission medications   Medication Sig Start Date End Date Taking? Authorizing Provider  ALPRAZolam Prudy Feeler(XANAX) 0.5 MG tablet Take 0.5 mg by mouth 2 (two) times daily as needed for anxiety.     Historical Provider, MD  Ascorbic Acid (VITAMIN C) 1000 MG tablet Take 1,000 mg by mouth daily.    Historical Provider, MD  cholecalciferol (VITAMIN D) 1000 UNITS tablet Take 1,000 Units by mouth every morning.     Historical Provider, MD  HYDROcodone-acetaminophen (NORCO) 10-325  MG per tablet Take 1 tablet by mouth every 6 (six) hours as needed.    Historical Provider, MD  losartan (COZAAR) 25 MG tablet Take 25 mg by mouth every morning.     Historical Provider, MD  ondansetron (ZOFRAN ODT) 4 MG disintegrating tablet Take 1 tablet (4 mg total) by mouth every 8 (eight) hours as needed for nausea or vomiting. 10/16/13   Peyton Najjaravid H Hopper, MD  PARoxetine (PAXIL) 40 MG tablet Take 40 mg by mouth every morning.    Historical Provider, MD  promethazine (PHENERGAN) 12.5 MG tablet Take 1 tablet (12.5 mg total) by mouth every 6 (six) hours as needed for nausea or vomiting. 10/17/13   Andrena MewsMichael D Rigby, DO  propranolol ER (INDERAL LA) 120 MG 24 hr capsule Take 120 mg by mouth daily.    Historical Provider, MD    ALLERGIES:  No Known Allergies  SOCIAL HISTORY:  History  Substance Use Topics  . Smoking status: Former Smoker -- 0.15 packs/day for 2 years    Types: Cigarettes    Quit date: 01/11/2004  . Smokeless tobacco: Never Used  . Alcohol Use: 0.0 oz/week    3-5 Glasses of wine per week     Comment: occasionally    FAMILY HISTORY: Family History  Problem Relation Age of Onset  . Hypertension Mother   . Hypothyroidism Mother   . Hypertension Father   . Alcohol abuse Sister   . Asthma Son   . Allergies Son   . Diabetes Paternal Grandmother  EXAM: BP 138/88 mmHg  Pulse 68  Temp(Src) 97.7 F (36.5 C) (Oral)  Ht 5' (1.524 m)  Wt 179 lb (81.194 kg)  BMI 34.96 kg/m2 CONSTITUTIONAL: Alert and oriented and responds appropriately to questions. Well-appearing; well-nourished, appears uncomfortable but nontoxic HEAD: Normocephalic EYES: Conjunctivae clear, PERRL ENT: normal nose; no rhinorrhea; moist mucous membranes; pharynx without lesions noted NECK: Supple, no meningismus, no LAD  CARD: RRR; S1 and S2 appreciated; no murmurs, no clicks, no rubs, no gallops RESP: Normal chest excursion without splinting or tachypnea; breath sounds clear and equal bilaterally; no  wheezes, no rhonchi, no rales, mildly tender to palpation over the left lateral chest wall without crepitus or ecchymosis or deformity ABD/GI: Normal bowel sounds; non-distended; soft, a shin is very tender to palpation throughout the left abdomen with guarding, no splenomegaly appreciated, no rebound, no peritoneal signs BACK:  The back appears normal and is non-tender to palpation, there is no CVA tenderness EXT: Normal ROM in all joints; non-tender to palpation; no edema; normal capillary refill; no cyanosis    SKIN: Normal color for age and race; warm NEURO: Moves all extremities equally PSYCH: The patient's mood and manner are appropriate. Grooming and personal hygiene are appropriate.  MEDICAL DECISION MAKING: Patient here with left lower rib pain and diffuse left-sided abdominal pain. Differential is large including musculoskeletal pain, pancreatitis, colitis, kidney stone, pyelonephritis, pneumonia. She is PERC negative and has no risk factors for heart disease other than tobacco use many years ago, quit in 2006. Will obtain abdominal labs, left rib series, CT of abdomen and pelvis, urine. Will give pain and nausea medicine. EKG shows no ischemic changes, no interval changes or arrhythmia.    ED PROGRESS: Patient's labs are unremarkable other than mild elevation of AST and ALT which is chronic. No leukocytosis. Urine shows small hemoglobin but no other sign of infection. Pregnancy test negative.  CT scan shows no acute abnormality. No obvious ureterolithiasis or kidney stones. Left rib series is also normal with no fracture or infiltrate. Discussed with patient that this may be musculoskeletal pain. We'll discharge with ibuprofen, Percocet, Zofran. Discussed return precautions. She verbalizes understanding is comfortable with plan.      EKG Interpretation  Date/Time:  Wednesday March 26 2014 11:05:29 EDT Ventricular Rate:  66 PR Interval:  148 QRS Duration: 104 QT Interval:  410 QTC  Calculation: 429 R Axis:   70 Text Interpretation:  Normal sinus rhythm Normal ECG No significant change since last tracing Confirmed by Yuriko Portales,  DO, Swetha Rayle [phone]) on 03/26/2014 11:21:14 AM        Layla Maw Akil Hoos, DO 03/26/14 1242

## 2014-03-26 NOTE — ED Notes (Signed)
MD at bedside. 

## 2014-03-26 NOTE — ED Notes (Signed)
Pt directed to pharmacy to pick up medications- note for work given- pt states her sister is driving her home

## 2014-03-26 NOTE — Discharge Instructions (Signed)
Abdominal Pain, Women °Abdominal (stomach, pelvic, or belly) pain can be caused by many things. It is important to tell your doctor: °· The location of the pain. °· Does it come and go or is it present all the time? °· Are there things that start the pain (eating certain foods, exercise)? °· Are there other symptoms associated with the pain (fever, nausea, vomiting, diarrhea)? °All of this is helpful to know when trying to find the cause of the pain. °CAUSES  °· Stomach: virus or bacteria infection, or ulcer. °· Intestine: appendicitis (inflamed appendix), regional ileitis (Crohn's disease), ulcerative colitis (inflamed colon), irritable bowel syndrome, diverticulitis (inflamed diverticulum of the colon), or cancer of the stomach or intestine. °· Gallbladder disease or stones in the gallbladder. °· Kidney disease, kidney stones, or infection. °· Pancreas infection or cancer. °· Fibromyalgia (pain disorder). °· Diseases of the female organs: °¨ Uterus: fibroid (non-cancerous) tumors or infection. °¨ Fallopian tubes: infection or tubal pregnancy. °¨ Ovary: cysts or tumors. °¨ Pelvic adhesions (scar tissue). °¨ Endometriosis (uterus lining tissue growing in the pelvis and on the pelvic organs). °¨ Pelvic congestion syndrome (female organs filling up with blood just before the menstrual period). °¨ Pain with the menstrual period. °¨ Pain with ovulation (producing an egg). °¨ Pain with an IUD (intrauterine device, birth control) in the uterus. °¨ Cancer of the female organs. °· Functional pain (pain not caused by a disease, may improve without treatment). °· Psychological pain. °· Depression. °DIAGNOSIS  °Your doctor will decide the seriousness of your pain by doing an examination. °· Blood tests. °· X-rays. °· Ultrasound. °· CT scan (computed tomography, special type of X-ray). °· MRI (magnetic resonance imaging). °· Cultures, for infection. °· Barium enema (dye inserted in the large intestine, to better view it with  X-rays). °· Colonoscopy (looking in intestine with a lighted tube). °· Laparoscopy (minor surgery, looking in abdomen with a lighted tube). °· Major abdominal exploratory surgery (looking in abdomen with a large incision). °TREATMENT  °The treatment will depend on the cause of the pain.  °· Many cases can be observed and treated at home. °· Over-the-counter medicines recommended by your caregiver. °· Prescription medicine. °· Antibiotics, for infection. °· Birth control pills, for painful periods or for ovulation pain. °· Hormone treatment, for endometriosis. °· Nerve blocking injections. °· Physical therapy. °· Antidepressants. °· Counseling with a psychologist or psychiatrist. °· Minor or major surgery. °HOME CARE INSTRUCTIONS  °· Do not take laxatives, unless directed by your caregiver. °· Take over-the-counter pain medicine only if ordered by your caregiver. Do not take aspirin because it can cause an upset stomach or bleeding. °· Try a clear liquid diet (broth or water) as ordered by your caregiver. Slowly move to a bland diet, as tolerated, if the pain is related to the stomach or intestine. °· Have a thermometer and take your temperature several times a day, and record it. °· Bed rest and sleep, if it helps the pain. °· Avoid sexual intercourse, if it causes pain. °· Avoid stressful situations. °· Keep your follow-up appointments and tests, as your caregiver orders. °· If the pain does not go away with medicine or surgery, you may try: °¨ Acupuncture. °¨ Relaxation exercises (yoga, meditation). °¨ Group therapy. °¨ Counseling. °SEEK MEDICAL CARE IF:  °· You notice certain foods cause stomach pain. °· Your home care treatment is not helping your pain. °· You need stronger pain medicine. °· You want your IUD removed. °· You feel faint or   lightheaded.  You develop nausea and vomiting.  You develop a rash.  You are having side effects or an allergy to your medicine. SEEK IMMEDIATE MEDICAL CARE IF:   Your  pain does not go away or gets worse.  You have a fever.  Your pain is felt only in portions of the abdomen. The right side could possibly be appendicitis. The left lower portion of the abdomen could be colitis or diverticulitis.  You are passing blood in your stools (bright red or black tarry stools, with or without vomiting).  You have blood in your urine.  You develop chills, with or without a fever.  You pass out. MAKE SURE YOU:   Understand these instructions.  Will watch your condition.  Will get help right away if you are not doing well or get worse. Document Released: 10/24/2006 Document Revised: 05/13/2013 Document Reviewed: 11/13/2008 [facility] Patient Information 2015 [place], [place]. This information is not intended to replace advice given to you by your health care provider. Make sure you discuss any questions you have with your health care provider.  Chest Wall Pain Chest wall pain is pain in or around the bones and muscles of your chest. It may take up to 6 weeks to get better. It may take longer if you must stay physically active in your work and activities.  CAUSES  Chest wall pain may happen on its own. However, it may be caused by:  A viral illness like the flu.  Injury.  Coughing.  Exercise.  Arthritis.  Fibromyalgia.  Shingles. HOME CARE INSTRUCTIONS   Avoid overtiring physical activity. Try not to strain or perform activities that cause pain. This includes any activities using your chest or your abdominal and side muscles, especially if heavy weights are used.  Put ice on the sore area.  Put ice in a plastic bag.  Place a towel between your skin and the bag.  Leave the ice on for 15-20 minutes per hour while awake for the first 2 days.  Only take over-the-counter or prescription medicines for pain, discomfort, or fever as directed by your caregiver. SEEK IMMEDIATE MEDICAL CARE IF:   Your pain increases, or you are very uncomfortable.  You  have a fever.  Your chest pain becomes worse.  You have new, unexplained symptoms.  You have nausea or vomiting.  You feel sweaty or lightheaded.  You have a cough with phlegm (sputum), or you cough up blood. MAKE SURE YOU:   Understand these instructions.  Will watch your condition.  Will get help right away if you are not doing well or get worse. Document Released: 12/27/2004 Document Revised: 03/21/2011 Document Reviewed: 08/23/2010 [facility] Patient Information 2015 [place], [place]. This information is not intended to replace advice given to you by your health care provider. Make sure you discuss any questions you have with your health care provider.

## 2014-03-26 NOTE — ED Notes (Signed)
Patient transported to X-ray 

## 2014-04-15 ENCOUNTER — Ambulatory Visit (INDEPENDENT_AMBULATORY_CARE_PROVIDER_SITE_OTHER): Payer: BLUE CROSS/BLUE SHIELD | Admitting: Physician Assistant

## 2014-04-15 VITALS — BP 120/88 | HR 80 | Temp 98.0°F | Resp 18 | Ht 60.5 in | Wt 191.4 lb

## 2014-04-15 DIAGNOSIS — J069 Acute upper respiratory infection, unspecified: Secondary | ICD-10-CM | POA: Diagnosis not present

## 2014-04-15 DIAGNOSIS — R6889 Other general symptoms and signs: Secondary | ICD-10-CM | POA: Diagnosis not present

## 2014-04-15 DIAGNOSIS — B9789 Other viral agents as the cause of diseases classified elsewhere: Principal | ICD-10-CM

## 2014-04-15 LAB — POCT INFLUENZA A/B
INFLUENZA A, POC: NEGATIVE
INFLUENZA B, POC: NEGATIVE

## 2014-04-15 MED ORDER — CETIRIZINE-PSEUDOEPHEDRINE ER 5-120 MG PO TB12: 1.0000 | ORAL_TABLET | ORAL | Status: AC

## 2014-04-15 MED ORDER — NAPROXEN 500 MG PO TABS: 500.0000 mg | ORAL_TABLET | Freq: Two times a day (BID) | ORAL | Status: DC

## 2014-04-15 MED ORDER — HYDROCODONE-HOMATROPINE 5-1.5 MG/5ML PO SYRP: 5.0000 mL | ORAL_SOLUTION | Freq: Three times a day (TID) | ORAL | Status: DC | PRN

## 2014-04-15 NOTE — Progress Notes (Signed)
04/15/2014 at 12:54 PM  Kristen Klein E Kristen / DOB: 1985-06-15 / MRN: [id number]  The patient has Sterilization; Difficult intubation; and Concussion with no loss of consciousness on her problem list.  SUBJECTIVE  Chief complaint: Cough and Generalized Body Aches   History of present illness: Kristen Klein is 29 y.o. well appearing female presenting for cough and myagia which she states is severe. This started 3 days ago and is worsening . Associated symptoms include HA, cough, sore throat and nasal congestion and tactile fever. She denies chest pain, SOB, changes in urination, defecation. She has been drinking a "good amount" of water, and has not felt like eating.  She has tried ibuprofen with fair to poor relief.  She has no history of seasonal allergies.  She has been exposed to individuals with the flu at work.       She  has a past medical history of Hypertension; Thyroid disease; PFO (patent foramen ovale); Depression; Headache(784.0); Sterilization (12/09/2011); Difficult intubation (12/09/2011); Anxiety; and Migraine.    She has a current medication list which includes the following prescription(s): alprazolam, cholecalciferol, ibuprofen, losartan, ondansetron, paroxetine, propranolol er, cetirizine-pseudoephedrine, hydrocodone-homatropine, and naproxen.  [name] has No Known Allergies. She  reports that she quit smoking about 10 years ago. Her smoking use included Cigarettes. She has a .3 pack-year smoking history. She has never used smokeless tobacco. She reports that she drinks alcohol. She reports that she does not use illicit drugs. She  reports that she currently engages in sexual activity. She reports using the following method of birth control/protection: Surgical. The patient  has past surgical history that includes Wisdom tooth extraction (2010); Dilation and evacuation (2009); Cholecystectomy open (2011); Cryotherapy; Dilatation & currettage/hysteroscopy with essure  (12/09/2011); and TEE without cardioversion (N/A, 08/13/2013).  Her family history includes Alcohol abuse in her sister; Allergies in her son; Asthma in her son; Diabetes in her paternal grandmother; Hypertension in her father and mother; Hypothyroidism in her mother.  Review of Systems  Constitutional: Negative for fever, chills, weight loss, malaise/fatigue and diaphoresis.  HENT: Positive for sore throat.   Eyes: Negative.   Respiratory: Positive for cough. Negative for shortness of breath and wheezing.   Cardiovascular: Negative for chest pain.  Gastrointestinal: Negative.   Genitourinary: Negative.   Musculoskeletal: Positive for myalgias.  Skin: Negative.   Neurological: Positive for headaches. Negative for dizziness and weakness.    OBJECTIVE  Her  height is 5' 0.5" (1.537 m) and weight is 191 lb 6.4 oz (86.818 kg). Her oral temperature is 98 F (36.7 C). Her blood pressure is 120/88 and her pulse is 80. Her respiration is 18 and oxygen saturation is 97%.  The patient's body mass index is 36.75 kg/(m^2).  Physical Exam  Constitutional: She is oriented to person, place, and time. She appears well-developed and well-nourished. No distress.  Cardiovascular: Normal rate, regular rhythm and normal heart sounds.   Respiratory: Effort normal and breath sounds normal.  Lymphadenopathy:    She has no cervical adenopathy.  Neurological: She is alert and oriented to person, place, and time.  Skin: Skin is warm and dry. She is not diaphoretic.  Psychiatric: She has a normal mood and affect. Her behavior is normal. Judgment and thought content normal.    Results for orders placed or performed in visit on 04/15/14 (from the past 24 hour(s))  POCT Influenza A/B     Status: None   Collection Time: 04/15/14 11:25 AM  Result Value Ref Range  Influenza A, POC Negative    Influenza B, POC Negative     ASSESSMENT & PLAN  Josie was seen today for cough Kristen Cornfieldand generalized body  aches.  Diagnoses and all orders for this visit:  Viral URI with cough:  Vitals and PE reassuring.  Flu doubtful.  Will treat with problem 2.    Flu-like symptoms Orders: -     POCT Influenza A/B -     cetirizine-pseudoephedrine (ZYRTEC-D ALLERGY & CONGESTION) 5-120 MG per tablet; Take 1 tablet by mouth every morning. Do not use OTC cold remedies with this medication. -     naproxen (NAPROSYN) 500 MG tablet; Take 1 tablet (500 mg total) by mouth 2 (two) times daily with a meal. -     HYDROcodone-homatropine (HYCODAN) 5-1.5 MG/5ML syrup; Take 5 mLs by mouth every 8 (eight) hours as needed for cough.    The patient was advised to call or come back to clinic if she does not see an improvement in symptoms, or worsens with the above plan.   Deliah BostonMichael Javia Dillow, MHS, PA-C [facility] 04/15/2014 12:54 PM

## 2015-12-21 ENCOUNTER — Encounter (HOSPITAL_COMMUNITY): Payer: Self-pay | Admitting: Emergency Medicine

## 2015-12-21 ENCOUNTER — Emergency Department (HOSPITAL_COMMUNITY): Payer: BLUE CROSS/BLUE SHIELD

## 2015-12-21 ENCOUNTER — Emergency Department (HOSPITAL_COMMUNITY)
Admission: EM | Admit: 2015-12-21 | Discharge: 2015-12-21 | Disposition: A | Discharge status: Home / Self Care | Payer: BLUE CROSS/BLUE SHIELD | Attending: Emergency Medicine | Admitting: Emergency Medicine

## 2015-12-21 DIAGNOSIS — I10 Essential (primary) hypertension: Secondary | ICD-10-CM | POA: Insufficient documentation

## 2015-12-21 DIAGNOSIS — Z87891 Personal history of nicotine dependence: Secondary | ICD-10-CM | POA: Diagnosis not present

## 2015-12-21 DIAGNOSIS — B379 Candidiasis, unspecified: Secondary | ICD-10-CM | POA: Diagnosis not present

## 2015-12-21 DIAGNOSIS — R059 Cough, unspecified: Secondary | ICD-10-CM

## 2015-12-21 DIAGNOSIS — R05 Cough: Secondary | ICD-10-CM | POA: Diagnosis not present

## 2015-12-21 DIAGNOSIS — B370 Candidal stomatitis: Secondary | ICD-10-CM

## 2015-12-21 LAB — COMPREHENSIVE METABOLIC PANEL
ALBUMIN: 4.9 g/dL (ref 3.5–5.0)
ALK PHOS: 73 U/L (ref 38–126)
ALT: 52 U/L (ref 14–54)
AST: 27 U/L (ref 15–41)
Anion gap: 9 (ref 5–15)
BILIRUBIN TOTAL: 1 mg/dL (ref 0.3–1.2)
BUN: 22 mg/dL — ABNORMAL HIGH (ref 6–20)
CALCIUM: 9.3 mg/dL (ref 8.9–10.3)
CO2: 26 mmol/L (ref 22–32)
Chloride: 104 mmol/L (ref 101–111)
Creatinine, Ser: 0.74 mg/dL (ref 0.44–1.00)
GFR calc Af Amer: >60 mL/min (ref >60)
GFR calc non Af Amer: >60 mL/min (ref >60)
GLUCOSE: 92 mg/dL (ref 65–99)
Potassium: 3.4 mmol/L — ABNORMAL LOW (ref 3.5–5.1)
Sodium: 139 mmol/L (ref 135–145)
Total Protein: 8.1 g/dL (ref 6.5–8.1)

## 2015-12-21 LAB — CBC WITH DIFFERENTIAL/PLATELET
BASOS PCT: 0 %
Basophils Absolute: 0 10*3/uL (ref 0.0–0.1)
Eosinophils Absolute: 0.1 10*3/uL (ref 0.0–0.7)
Eosinophils Relative: 0 %
HEMATOCRIT: 43 % (ref 36.0–46.0)
Hemoglobin: 15.1 g/dL — ABNORMAL HIGH (ref 12.0–15.0)
LYMPHS PCT: 35 %
Lymphs Abs: 4.6 10*3/uL — ABNORMAL HIGH (ref 0.7–4.0)
MCH: 30.4 pg (ref 26.0–34.0)
MCHC: 35.1 g/dL (ref 30.0–36.0)
MCV: 86.5 fL (ref 78.0–100.0)
MONO ABS: 0.8 10*3/uL (ref 0.1–1.0)
MONOS PCT: 6 %
Neutro Abs: 7.9 10*3/uL — ABNORMAL HIGH (ref 1.7–7.7)
Neutrophils Relative %: 59 %
Platelets: 301 10*3/uL (ref 150–400)
RBC: 4.97 MIL/uL (ref 3.87–5.11)
RDW: 12.7 % (ref 11.5–15.5)
WBC: 13.3 10*3/uL — ABNORMAL HIGH (ref 4.0–10.5)

## 2015-12-21 LAB — URINALYSIS, ROUTINE W REFLEX MICROSCOPIC
Bilirubin Urine: NEGATIVE
Glucose, UA: NEGATIVE mg/dL
Hgb urine dipstick: NEGATIVE
Ketones, ur: NEGATIVE mg/dL
LEUKOCYTES UA: NEGATIVE
Nitrite: NEGATIVE
Protein, ur: 30 mg/dL — AB
Specific Gravity, Urine: 1.033 — ABNORMAL HIGH (ref 1.005–1.030)
pH: 5 (ref 5.0–8.0)

## 2015-12-21 LAB — I-STAT CG4 LACTIC ACID, ED: Lactic Acid, Venous: 0.91 mmol/L (ref 0.5–1.9)

## 2015-12-21 MED ORDER — BENZONATATE 100 MG PO CAPS: 100.0000 mg | ORAL_CAPSULE | Freq: Three times a day (TID) | ORAL | 0 refills | Status: DC

## 2015-12-21 MED ORDER — NYSTATIN 100000 UNIT/ML MT SUSP: 500000.0000 [IU] | Freq: Four times a day (QID) | OROMUCOSAL | 0 refills | Status: DC

## 2015-12-21 MED ORDER — ONDANSETRON 4 MG PO TBDP: 4.0000 mg | ORAL_TABLET | Freq: Three times a day (TID) | ORAL | 0 refills | Status: DC | PRN

## 2015-12-21 NOTE — ED Notes (Signed)
C/O OF THRUSH. EDPA AWARE

## 2015-12-21 NOTE — ED Notes (Signed)
EDPA Provider at bedside. 

## 2015-12-21 NOTE — ED Provider Notes (Signed)
[facility]-EMERGENCY DEPT Provider Note   CSN: [id number] Arrival date & time: 12/21/15  1031     History   Chief Complaint Chief Complaint  Patient presents with  . Cough  . Weakness    HPI Kristen Klein is a 30 y.o. female.  The history is provided by the patient and medical records.  Cough   Weakness    30 year old female with history of anxiety, depression, headaches, hypertension, migraines, thyroid disease, hx of sterilization, presenting to the ED for cough and weakness. Patient reports she was diagnosed with pneumonia by her primary care doctor. She was started on Levaquin.  States she completed first course, but had recurrent infiltrates on repeat chest x-ray last Thursday (4 days ago), so second course ordered. States she has 2 days left of this course of antibiotics.  She is also currently on prednisone taper. States her cough has improved, it is now dry and nonproductive. She reports some continued fatigue, decreased appetite, and decreased energy level. Reports some diarrhea and mild nausea which she thinks is due to the antibiotics. No abdominal pain.  She denies any chest pain or shortness of breath. No fever or chills. States she often gets flushed at night time.  Patient states she called her primary care doctor and was sent here for evaluation today.  Also reports she thinks she has gotten thrush on her tongue.  Hx of same in the past.    Past Medical History:  Diagnosis Date  . Anxiety   . Depression   . Difficult intubation 12/09/2011  . Headache(784.0)   . Hypertension   . Migraine   . PFO (patent foramen ovale)   . Sterilization 12/09/2011  . Thyroid disease    hyperthyroid    Patient Active Problem List   Diagnosis Date Noted  . Concussion with no loss of consciousness 10/17/2013  . Sterilization 12/09/2011  . Difficult intubation 12/09/2011    Past Surgical History:  Procedure Laterality Date  . CHOLECYSTECTOMY OPEN  2011  . CRYOTHERAPY    .  DILATION AND EVACUATION  2009  . DILITATION & CURRETTAGE/HYSTROSCOPY WITH ESSURE  12/09/2011   Procedure: DILATATION & CURETTAGE/HYSTEROSCOPY WITH ESSURE;  Surgeon: Robley FriesVaishali R Mody, MD;  Location: [facility];  Service: Gynecology;  Laterality: N/A;  . TEE WITHOUT CARDIOVERSION N/A 08/13/2013   Procedure: TRANSESOPHAGEAL ECHOCARDIOGRAM (TEE);  Surgeon: Pamella PertJagadeesh R Ganji, MD;  [facility];  Service: Cardiovascular;  Laterality: N/A;  . WISDOM TOOTH EXTRACTION  2010    OB History    Gravida Para Term Preterm AB Living   3 2 1 1 1 2    SAB TAB Ectopic Multiple Live Births   1 0 0 0 1       Home Medications    Prior to Admission medications   Medication Sig Start Date End Date Taking? Authorizing Provider  ALPRAZolam Prudy Feeler(XANAX) 0.5 MG tablet Take 0.5 mg by mouth 2 (two) times daily as needed for anxiety.     Historical Provider, MD  cholecalciferol (VITAMIN D) 1000 UNITS tablet Take 1,000 Units by mouth every morning.     Historical Provider, MD  FLUoxetine (PROZAC) 20 MG capsule Take 20 mg by mouth daily. 11/17/15   Historical Provider, MD  hydrochlorothiazide (HYDRODIURIL) 25 MG tablet Take 25 mg by mouth daily. 11/03/15   Historical Provider, MD  HYDROcodone-homatropine (HYCODAN) 5-1.5 MG/5ML syrup Take 5 mLs by mouth every 8 (eight) hours as needed for cough. 04/15/14   Ofilia NeasMichael L Clark, PA-C  ibuprofen (  ADVIL,MOTRIN) 800 MG tablet Take 1 tablet (800 mg total) by mouth every 8 (eight) hours as needed for mild pain. 03/26/14   Kristen N Ward, DO  levofloxacin (LEVAQUIN) 500 MG tablet Take 500 mg by mouth daily. Started 12/7 x 4 days 12/17/15   Historical Provider, MD  lisinopril (PRINIVIL,ZESTRIL) 5 MG tablet Take 5 mg by mouth daily. 10/20/15   Historical Provider, MD  losartan (COZAAR) 25 MG tablet Take 25 mg by mouth every morning.     Historical Provider, MD  naproxen (NAPROSYN) 500 MG tablet Take 1 tablet (500 mg total) by mouth 2 (two) times daily with a meal. 04/15/14   Ofilia Neas,  PA-C  ondansetron (ZOFRAN ODT) 4 MG disintegrating tablet Take 1 tablet (4 mg total) by mouth every 8 (eight) hours as needed for nausea or vomiting. 03/26/14   Kristen N Ward, DO  PARoxetine (PAXIL) 40 MG tablet Take 40 mg by mouth every morning.    Historical Provider, MD  propranolol ER (INDERAL LA) 120 MG 24 hr capsule Take 120 mg by mouth daily.    Historical Provider, MD  VENTOLIN HFA 108 (90 Base) MCG/ACT inhaler Take 1 puff by mouth every 4 (four) hours as needed for wheezing. 12/08/15   Historical Provider, MD  zolpidem (AMBIEN) 5 MG tablet Take 5 mg by mouth at bedtime as needed for sleep. 10/07/15   Historical Provider, MD    Family History Family History  Problem Relation Age of Onset  . Hypertension Mother   . Hypothyroidism Mother   . Hypertension Father   . Alcohol abuse Sister   . Asthma Son   . Allergies Son   . Diabetes Paternal Grandmother     Social History Social History  Substance Use Topics  . Smoking status: Former Smoker    Packs/day: 0.15    Years: 2.00    Types: Cigarettes    Quit date: 01/11/2004  . Smokeless tobacco: Never Used  . Alcohol use 0.0 oz/week    3 - 5 Glasses of wine per week     Comment: occasionally     Allergies   Patient has no known allergies.   Review of Systems Review of Systems  Constitutional: Positive for appetite change and fever.  Respiratory: Positive for cough.   Neurological: Positive for weakness.  All other systems reviewed and are negative.    Physical Exam Updated Vital Signs BP 127/83   Pulse 70   Temp 97.5 F (36.4 C) (Oral)   Resp 16   Ht 5' (1.524 m)   Wt 79.8 kg   LMP 12/02/2015   SpO2 100%   BMI 34.37 kg/m   Physical Exam  Constitutional: She is oriented to person, place, and time. She appears well-developed and well-nourished.  HENT:  Head: Normocephalic and atraumatic.  Mouth/Throat: Oropharynx is clear and moist.  Thrush noted on tongue; no oral lesions noted; tonsils overall normal in  appearance bilaterally without exudate; uvula midline without evidence of peritonsillar abscess; handling secretions appropriately; no difficulty swallowing or speaking; normal phonation without stridor  Eyes: Conjunctivae and EOM are normal. Pupils are equal, round, and reactive to light.  Neck: Normal range of motion.  Cardiovascular: Normal rate, regular rhythm and normal heart sounds.   Pulmonary/Chest: Effort normal and breath sounds normal. No respiratory distress. She has no wheezes. She has no rhonchi.  Lungs clear without wheezes or rhonchi, no distress, speaking in full sentences without difficulty, O2 sats 100% on room air.  Abdominal:  Soft. Bowel sounds are normal. There is no tenderness. There is no rebound.  Soft, benign  Musculoskeletal: Normal range of motion.  Neurological: She is alert and oriented to person, place, and time.  Skin: Skin is warm and dry.  Psychiatric: She has a normal mood and affect.  Nursing note and vitals reviewed.    ED Treatments / Results  Labs (all labs ordered are listed, but only abnormal results are displayed) Labs Reviewed  COMPREHENSIVE METABOLIC PANEL - Abnormal; Notable for the following:       Result Value   Potassium 3.4 (*)    BUN 22 (*)    All other components within normal limits  CBC WITH DIFFERENTIAL/PLATELET - Abnormal; Notable for the following:    WBC 13.3 (*)    Hemoglobin 15.1 (*)    Neutro Abs 7.9 (*)    Lymphs Abs 4.6 (*)    All other components within normal limits  URINALYSIS, ROUTINE W REFLEX MICROSCOPIC - Abnormal; Notable for the following:    Color, Urine AMBER (*)    APPearance HAZY (*)    Specific Gravity, Urine 1.033 (*)    Protein, ur 30 (*)    Bacteria, UA RARE (*)    Squamous Epithelial / LPF 6-30 (*)    All other components within normal limits  I-STAT CG4 LACTIC ACID, ED  I-STAT CG4 LACTIC ACID, ED    EKG  EKG Interpretation None       Radiology Dg Chest 2 View  Result Date:  12/21/2015 CLINICAL DATA:  Cough, congestion, shortness of breath and left-sided chest pain for 3 weeks. EXAM: CHEST  2 VIEW COMPARISON:  PA and lateral chest 04/15/2013. FINDINGS: The lungs are clear. Heart size is normal. There is no pneumothorax or pleural effusion. No bony abnormality is identified. IMPRESSION: Negative chest. Electronically Signed   By: Drusilla Kannerhomas  Dalessio M.D.   On: 12/21/2015 11:28    Procedures Procedures (including critical care time)  Medications Ordered in ED Medications - No data to display   Initial Impression / Assessment and Plan / ED Course  I have reviewed the triage vital signs and the nursing notes.  Pertinent labs & imaging results that were available during my care of the patient were reviewed by me and considered in my medical decision making (see chart for details).  Clinical Course    30 year old female here with cough and weakness. Recent diagnosis of pneumonia, currently on Levaquin and steroid taper. Here she is afebrile and nontoxic. No chest pain or SOB.  Her lungs are clear without wheezes or rhonchi on exam. She is in no acute respiratory distress. She does have a small amount of thrush on the tongue, oropharynx otherwise clear.  Abdomen soft, benign. Chest x-ray today without acute infiltrates noted. Lab work is reassuring-- leukocytosis noted, however currently on prednisone. UA without any signs of infection. Patient has had female sterilization in the past, pregnancy not an option.  Last TSH was WNL.  At this time, patient does not have any acute findings that would warrant admission. I had a long discussion with her about continue symptomatic care. I recommended that she eat with her antibiotics to help reduce GI upset. Will refill her Tessalon as this has been helping with cough, nystatin rinse for thrush.  FU with PCP.  Discussed plan with patient, he/she acknowledged understanding and agreed with plan of care.  Return precautions given for new or  worsening symptoms.   Final Clinical Impressions(s) / ED Diagnoses  Final diagnoses:  Cough  Thrush    New Prescriptions Discharge Medication List as of 12/21/2015  2:09 PM    START taking these medications   Details  benzonatate (TESSALON) 100 MG capsule Take 1 capsule (100 mg total) by mouth every 8 (eight) hours., Starting Mon 12/21/2015, Print    nystatin (MYCOSTATIN) 100000 UNIT/ML suspension Take 5 mLs (500,000 Units total) by mouth 4 (four) times daily. Swish and spit., Starting Mon 12/21/2015, Print         Garlon Hatchet, PA-C 12/21/15 1447    Mancel Bale, MD 12/23/15 2042

## 2015-12-21 NOTE — Discharge Instructions (Signed)
Take the prescribed medication as directed.  Make sure you are eating and drinking regularly. Follow-up with your primary care doctor. Return to the ED for new or worsening symptoms.

## 2015-12-21 NOTE — ED Triage Notes (Signed)
Pt reports persistent cough x 17 days, recent dx pneumonia, completed antibiotics, sts no relief. Weakness, shortness of breath and body aches. Alert and oriented x4.

## 2016-05-01 ENCOUNTER — Emergency Department (HOSPITAL_COMMUNITY)
Admission: EM | Admit: 2016-05-01 | Discharge: 2016-05-01 | Disposition: A | Discharge status: Home / Self Care | Payer: BLUE CROSS/BLUE SHIELD | Source: Home / Self Care | Attending: Emergency Medicine | Admitting: Emergency Medicine

## 2016-05-01 ENCOUNTER — Encounter (HOSPITAL_COMMUNITY): Payer: Self-pay | Admitting: Emergency Medicine

## 2016-05-01 ENCOUNTER — Emergency Department (HOSPITAL_COMMUNITY): Payer: BLUE CROSS/BLUE SHIELD

## 2016-05-01 DIAGNOSIS — R1013 Epigastric pain: Secondary | ICD-10-CM

## 2016-05-01 DIAGNOSIS — Z79899 Other long term (current) drug therapy: Secondary | ICD-10-CM | POA: Insufficient documentation

## 2016-05-01 DIAGNOSIS — Z87891 Personal history of nicotine dependence: Secondary | ICD-10-CM

## 2016-05-01 DIAGNOSIS — N898 Other specified noninflammatory disorders of vagina: Secondary | ICD-10-CM | POA: Insufficient documentation

## 2016-05-01 DIAGNOSIS — E039 Hypothyroidism, unspecified: Secondary | ICD-10-CM

## 2016-05-01 DIAGNOSIS — I10 Essential (primary) hypertension: Secondary | ICD-10-CM

## 2016-05-01 DIAGNOSIS — A4151 Sepsis due to Escherichia coli [E. coli]: Secondary | ICD-10-CM | POA: Diagnosis not present

## 2016-05-01 DIAGNOSIS — R109 Unspecified abdominal pain: Secondary | ICD-10-CM

## 2016-05-01 DIAGNOSIS — E876 Hypokalemia: Secondary | ICD-10-CM | POA: Diagnosis not present

## 2016-05-01 LAB — WET PREP, GENITAL
Clue Cells Wet Prep HPF POC: NONE SEEN
Sperm: NONE SEEN
TRICH WET PREP: NONE SEEN
Yeast Wet Prep HPF POC: NONE SEEN

## 2016-05-01 LAB — CBC WITH DIFFERENTIAL/PLATELET
BASOS ABS: 0 10*3/uL (ref 0.0–0.1)
BASOS PCT: 0 %
EOS PCT: 0 %
Eosinophils Absolute: 0 10*3/uL (ref 0.0–0.7)
HCT: 37.3 % (ref 36.0–46.0)
Hemoglobin: 13.4 g/dL (ref 12.0–15.0)
Lymphocytes Relative: 24 %
Lymphs Abs: 2.5 10*3/uL (ref 0.7–4.0)
MCH: 30.5 pg (ref 26.0–34.0)
MCHC: 35.9 g/dL (ref 30.0–36.0)
MCV: 84.8 fL (ref 78.0–100.0)
MONO ABS: 1 10*3/uL (ref 0.1–1.0)
Monocytes Relative: 10 %
Neutro Abs: 6.9 10*3/uL (ref 1.7–7.7)
Neutrophils Relative %: 66 %
PLATELETS: 234 10*3/uL (ref 150–400)
RBC: 4.4 MIL/uL (ref 3.87–5.11)
RDW: 12.3 % (ref 11.5–15.5)
WBC: 10.4 10*3/uL (ref 4.0–10.5)

## 2016-05-01 LAB — URINALYSIS, ROUTINE W REFLEX MICROSCOPIC
BACTERIA UA: NONE SEEN
Glucose, UA: NEGATIVE mg/dL
Ketones, ur: NEGATIVE mg/dL
NITRITE: NEGATIVE
PROTEIN: 30 mg/dL — AB
Specific Gravity, Urine: 1.017 (ref 1.005–1.030)
pH: 6 (ref 5.0–8.0)

## 2016-05-01 LAB — LIPASE, BLOOD: Lipase: 19 U/L (ref 11–51)

## 2016-05-01 LAB — BASIC METABOLIC PANEL
Anion gap: 9 (ref 5–15)
BUN: 8 mg/dL (ref 6–20)
CALCIUM: 8.9 mg/dL (ref 8.9–10.3)
CO2: 25 mmol/L (ref 22–32)
Chloride: 101 mmol/L (ref 101–111)
Creatinine, Ser: 0.72 mg/dL (ref 0.44–1.00)
GLUCOSE: 103 mg/dL — AB (ref 65–99)
Potassium: 3.3 mmol/L — ABNORMAL LOW (ref 3.5–5.1)
Sodium: 135 mmol/L (ref 135–145)

## 2016-05-01 LAB — POC URINE PREG, ED: PREG TEST UR: NEGATIVE

## 2016-05-01 MED ORDER — CEPHALEXIN 500 MG PO CAPS: 500.0000 mg | ORAL_CAPSULE | Freq: Four times a day (QID) | ORAL | 0 refills | Status: DC

## 2016-05-01 MED ORDER — OXYCODONE-ACETAMINOPHEN 5-325 MG PO TABS: 1.0000 | ORAL_TABLET | Freq: Once | ORAL | Status: DC

## 2016-05-01 MED ORDER — KETOROLAC TROMETHAMINE 30 MG/ML IJ SOLN
30.0000 mg | Freq: Once | INTRAMUSCULAR | Status: AC
  Administered 2016-05-01: 30 mg via INTRAVENOUS
  Filled 2016-05-01: qty 1

## 2016-05-01 MED ORDER — MORPHINE SULFATE (PF) 4 MG/ML IV SOLN
4.0000 mg | Freq: Once | INTRAVENOUS | Status: AC
  Administered 2016-05-01: 4 mg via INTRAVENOUS
  Filled 2016-05-01: qty 1

## 2016-05-01 MED ORDER — SODIUM CHLORIDE 0.9 % IV BOLUS (SEPSIS)
1000.0000 mL | Freq: Once | INTRAVENOUS | Status: AC
  Administered 2016-05-01: 1000 mL via INTRAVENOUS

## 2016-05-01 MED ORDER — OXYCODONE-ACETAMINOPHEN 5-325 MG PO TABS: 2.0000 | ORAL_TABLET | ORAL | 0 refills | Status: DC | PRN

## 2016-05-01 MED ORDER — ONDANSETRON HCL 4 MG/2ML IJ SOLN
4.0000 mg | Freq: Once | INTRAMUSCULAR | Status: AC
  Administered 2016-05-01: 4 mg via INTRAVENOUS
  Filled 2016-05-01: qty 2

## 2016-05-01 NOTE — ED Provider Notes (Signed)
[facility]-EMERGENCY DEPT Provider Note   CSN: [id number] Arrival date & time: 05/01/16  0820     History   Chief Complaint Chief Complaint  Patient presents with  . Flank Pain    HPI Kristen Klein is a 31 y.o. female who presents with 4 days of progressively worsening left-sided flank pain that radiates to the left lower abdomen. She states that pain significant only worsened yesterday. She describes it as a sharp pain that shoots down her left flank. Her pain is worsened with movement. No alleviating factors She reports associated nausea/vomiting, fever and chills that began yesterday. She reports a recorded temperature of 102.0 yesterday. She has been taking ibuprofen with no improvement of pain.  She denies any vaginal pain, vaginal discharge, vaginal bleeding. She denies any history of STDs. She does report a history of ovarian cysts but has not had any issues with him.    The history is provided by the patient.    Past Medical History:  Diagnosis Date  . Anxiety   . Depression   . Difficult intubation 12/09/2011  . Headache(784.0)   . Hypertension   . Migraine   . PFO (patent foramen ovale)   . Sterilization 12/09/2011  . Thyroid disease    hyperthyroid    Patient Active Problem List   Diagnosis Date Noted  . Concussion with no loss of consciousness 10/17/2013  . Sterilization 12/09/2011  . Difficult intubation 12/09/2011    Past Surgical History:  Procedure Laterality Date  . CHOLECYSTECTOMY OPEN  2011  . CRYOTHERAPY    . DILATION AND EVACUATION  2009  . DILITATION & CURRETTAGE/HYSTROSCOPY WITH ESSURE  12/09/2011   Procedure: DILATATION & CURETTAGE/HYSTEROSCOPY WITH ESSURE;  Surgeon: Robley Fries, MD;  Location: [facility];  Service: Gynecology;  Laterality: N/A;  . TEE WITHOUT CARDIOVERSION N/A 08/13/2013   Procedure: TRANSESOPHAGEAL ECHOCARDIOGRAM (TEE);  Surgeon: Pamella Pert, MD;  Location: [facility] ENDOSCOPY;  Service: Cardiovascular;  Laterality: N/A;  .  WISDOM TOOTH EXTRACTION  2010    OB History    Gravida Para Term Preterm AB Living   SAB TAB Ectopic Multiple Live Births   1 0 0 0 1       Home Medications    Prior to Admission medications   Medication Sig Start Date End Date Taking? Authorizing Provider  ALPRAZolam Prudy Feeler) 0.5 MG tablet Take 0.5 mg by mouth 2 (two) times daily as needed for anxiety.    Yes Historical Provider, MD  Biotin 1000 MCG tablet Take 5,000 mcg by mouth 3 (three) times daily.   Yes Historical Provider, MD  cholecalciferol (VITAMIN D) 1000 units tablet Take 2,000 Units by mouth daily.   Yes Historical Provider, MD  etodolac (LODINE) 400 MG tablet Take 400 mg by mouth 2 (two) times daily. 04/18/16  Yes Historical Provider, MD  FLUoxetine (PROZAC) 20 MG capsule Take 20 mg by mouth at bedtime.  11/17/15  Yes Historical Provider, MD  hydrochlorothiazide (HYDRODIURIL) 25 MG tablet Take 25 mg by mouth at bedtime.  11/03/15  Yes Historical Provider, MD  ibuprofen (ADVIL,MOTRIN) 200 MG tablet Take 800 mg by mouth every 6 (six) hours as needed.   Yes Historical Provider, MD  irbesartan (AVAPRO) 75 MG tablet Take 75 mg by mouth daily. 03/22/16  Yes Historical Provider, MD  zolpidem (AMBIEN) 5 MG tablet Take 5 mg by mouth at bedtime as needed for sleep. 10/07/15  Yes Historical Provider, MD  cephALEXin (  KEFLEX) 500 MG capsule Take 1 capsule (500 mg total) by mouth 4 (four) times daily. 05/01/16   Maxwell Caul, PA-C  oxyCODONE-acetaminophen (PERCOCET/ROXICET) 5-325 MG tablet Take 2 tablets by mouth every 4 (four) hours as needed for severe pain. 05/01/16   Maxwell Caul, PA-C    Family History Family History  Problem Relation Age of Onset  . Hypertension Mother   . Hypothyroidism Mother   . Hypertension Father   . Alcohol abuse Sister   . Asthma Son   . Allergies Son   . Diabetes Paternal Grandmother     Social History Social History  Substance Use Topics  . Smoking status: Former Smoker     Packs/day: 0.15    Years: 2.00    Types: Cigarettes    Quit date: 01/11/2004  . Smokeless tobacco: Never Used  . Alcohol use 0.0 oz/week    3 - 5 Glasses of wine per week     Comment: occasionally     Allergies   Patient has no known allergies.   Review of Systems Review of Systems  Constitutional: Positive for chills and fever.  HENT: Negative for congestion, rhinorrhea and sore throat.   Eyes: Negative for visual disturbance.  Respiratory: Negative for cough and shortness of breath.   Cardiovascular: Negative for chest pain.  Gastrointestinal: Negative for abdominal pain, diarrhea, nausea and vomiting.  Genitourinary: Positive for flank pain. Negative for dysuria, hematuria, vaginal bleeding, vaginal discharge and vaginal pain.  Musculoskeletal: Negative for back pain and neck pain.  Skin: Negative for rash.  Neurological: Negative for dizziness, weakness, numbness and headaches.  All other systems reviewed and are negative.    Physical Exam Updated Vital Signs BP 134/88   Pulse (!) 108   Temp 98.1 F (36.7 C) (Oral)   Resp 18   Ht 5' (1.524 m)   Wt 79.4 kg   SpO2 99%   BMI 34.18 kg/m   Physical Exam  Constitutional: She is oriented to person, place, and time. She appears well-developed and well-nourished.  Appears uncomfortable. Tearful throughout exam  HENT:  Head: Normocephalic and atraumatic.  Mouth/Throat: Oropharynx is clear and moist and mucous membranes are normal.  Eyes: Conjunctivae, EOM and lids are normal. Pupils are equal, round, and reactive to light.  Neck: Full passive range of motion without pain.  Cardiovascular: Normal rate, regular rhythm, normal heart sounds and normal pulses.  Exam reveals no gallop and no friction rub.   No murmur heard. Pulmonary/Chest: Effort normal and breath sounds normal.  Abdominal: Soft. Normal appearance and bowel sounds are normal. There is tenderness in the epigastric area. There is CVA tenderness (mild on left).  There is no rigidity and no guarding.  Genitourinary: Uterus normal. Uterus is not tender. Cervix exhibits no motion tenderness and no friability. Right adnexum displays no mass and no tenderness. Left adnexum displays no mass and no tenderness. Vaginal discharge (White, milky) found.  Genitourinary Comments: The exam was performed with a chaperone present. Normal external female genitalia.  Musculoskeletal: Normal range of motion.  Neurological: She is alert and oriented to person, place, and time.  Skin: Skin is warm and dry. Capillary refill takes less than 2 seconds.  Psychiatric: She has a normal mood and affect. Her speech is normal.  Nursing note and vitals reviewed.    ED Treatments / Results  Labs (all labs ordered are listed, but only abnormal results are displayed) Labs Reviewed  WET PREP, GENITAL - Abnormal; Notable for the  following:       Result Value   WBC, Wet Prep HPF POC MANY (*)    All other components within normal limits  URINALYSIS, ROUTINE W REFLEX MICROSCOPIC - Abnormal; Notable for the following:    APPearance HAZY (*)    Hgb urine dipstick MODERATE (*)    Bilirubin Urine SMALL (*)    Protein, ur 30 (*)    Leukocytes, UA MODERATE (*)    Squamous Epithelial / LPF 0-5 (*)    All other components within normal limits  BASIC METABOLIC PANEL - Abnormal; Notable for the following:    Potassium 3.3 (*)    Glucose, Bld 103 (*)    All other components within normal limits  CBC WITH DIFFERENTIAL/PLATELET  LIPASE, BLOOD  POC URINE PREG, ED  GC/CHLAMYDIA PROBE AMP ([place]) NOT AT [facility]    EKG  EKG Interpretation None       Radiology Ct Renal Stone Study  Result Date: 05/01/2016 CLINICAL DATA:  Left flank pain beginning last night. Fever and chills. EXAM: CT ABDOMEN AND PELVIS WITHOUT CONTRAST TECHNIQUE: Multidetector CT imaging of the abdomen and pelvis was performed following the standard protocol without IV contrast. COMPARISON:  03/26/2014 FINDINGS:  Lower chest: The visualized lung bases are clear. Hepatobiliary: Diffusely decreased attenuation of the liver consistent with steatosis. Prior cholecystectomy. No biliary dilatation. Pancreas: Unremarkable. Spleen: Unremarkable. Adrenals/Urinary Tract: Unremarkable adrenal glands. Punctate nonobstructing calculus in the lower pole of the left kidney (series 5, image 89). Two suspected punctate nonobstructing calculi in the interpolar region/lower pole of the right kidney. No right-sided hydronephrosis. No left-sided caliectasis, however there is mild left pelviectasis and moderate proximal hydroureter. The left ureter demonstrates an abrupt transition point in its proximal to midportion and is decompressed distally (series 2, image 41 and series 6, image 116). The proximal ureter is mildly prominent on multiple prior studies with an abrupt tapering at the same level, although proximal dilatation is greater on today's examination. No ureteral calculi are identified. There is an accessory renal vein draining the lower pole of the left kidney which crosses posterior to the ureter at the transition point, with the gonadal vein being immediately anterior to the ureter at this level. No perinephric stranding. The bladder is unremarkable. Stomach/Bowel: The stomach is within normal limits. There is no evidence of bowel obstruction or inflammation. The appendix is unremarkable. Vascular/Lymphatic: Normal caliber of the abdominal aorta. No enlarged lymph nodes. Reproductive: Unremarkable uterus and left ovary. 2.4 cm right adnexal cyst has a benign appearance. Prior Essure sterilization. Other: No intraperitoneal free fluid. No abdominal wall mass or hernia. Musculoskeletal: No acute osseous abnormality or suspicious osseous lesion. IMPRESSION: 1. Mild left pelviectasis and proximal hydroureter without obstructing stone identified. This could reflect recent stone passage or infection. The proximal to mid ureter abruptly  transitions in caliber with a similar though less prominent appearance on multiple prior examinations. This could reflect an underlying stricture or compression between the gonadal vein and an accessory renal vein. Contrast-enhanced hematuria protocol CT is recommended for further evaluation but may be performed as an outpatient unless more immediate evaluation is clinically necessary. 2. Hepatic steatosis. Electronically Signed   By: Sebastian Ache M.D.   On: 05/01/2016 13:30    Procedures Procedures (including critical care time)  Medications Ordered in ED Medications  sodium chloride 0.9 % bolus 1,000 mL (0 mLs Intravenous Stopped 05/01/16 1344)  ondansetron (ZOFRAN) injection 4 mg (4 mg Intravenous Given 05/01/16 1141)  morphine 4 MG/ML  injection 4 mg (4 mg Intravenous Given 05/01/16 1141)  ketorolac (TORADOL) 30 MG/ML injection 30 mg (30 mg Intravenous Given 05/01/16 1207)     Initial Impression / Assessment and Plan / ED Course  I have reviewed the triage vital signs and the nursing notes.  Pertinent labs & imaging results that were available during my care of the patient were reviewed by me and considered in my medical decision making (see chart for details).    31 year old female presents with 4 days of worsening left-sided flank pain that acutely worsened yesterday. Associated with fever, chills, nausea/vomiting. Physical exam with some left-sided tenderness and epigastric tenderness. Consider PID vs vs kidney stone versus pyelonephritis versus UTI. Low suspicion for ovarian torsion given duration of symptoms but still consideration. Also consider ovarian cysts rupture low suspicion given history/physical exam. Labs ordered at triage. UA significant for hemoglobin but no bacteria seen. Given unequivocal physical exam and lab findings will plan to do a pelvic exam. Additional blood work ordered. Patient given antiemetics and analgesics in the department for symptomatic relief.  Pelvic exam  performed with nurse as a chaperone. Speculum exam showed no vaginal lesions, cervical friability, or any other acute external abnormalities. She did have some white, milky discharge. Samples were taken for analysis. Bimanual exam showed no cervical motion tenderness, no adnexal tenderness. Will plan to CT renal scan to evaluate for kidney stone.   CT renal stone reviewed. Negative for any acute presence of stone but does report some ureteral dilation that suggests that she may have recently passed a stone. Does report questionable stricture that suggests she needs an outpatient CT for follow-up. Discussed patient with Dr. Adela Lank.   Discussed CT results with the patient. She is aware that she will need a further outpatient CT for further evaluation. She will follow up with her PCP.  Reevaluation: Patient reports that pain is improved but she is still experiencing some pain on the left side. Pain seems to be exacerbated by movement which suggests a musculoskeletal etiology.   Will plan to treat presumptive pyelonephritis. We will send urine for culture. Plan at this time to discharge her home with antibiotics and pain medication for symptomatic relief.  Discussed plan the patient. Instructed patient to follow-up with PCP in 2 days. Return precautions discussed. Patient expresses understanding and agreement to plan.    Final Clinical Impressions(s) / ED Diagnoses   Final diagnoses:  Flank pain    New Prescriptions New Prescriptions   CEPHALEXIN (KEFLEX) 500 MG CAPSULE    Take 1 capsule (500 mg total) by mouth 4 (four) times daily.   OXYCODONE-ACETAMINOPHEN (PERCOCET/ROXICET) 5-325 MG TABLET    Take 2 tablets by mouth every 4 (four) hours as needed for severe pain.     Maxwell Caul, PA-C 05/01/16 1558    Maxwell Caul, PA-C 05/01/16 1559    Melene Plan, DO 05/03/16 1123

## 2016-05-01 NOTE — Discharge Instructions (Signed)
While your CT showed no active kidney stone, does show that he may have possibly passed a kidney stone in the recent past.  Follow-up with your primary care doctor in 24-48 hours. Take your CT results with you and discussed doing an outpatient CT for further follow-up.  Take antibiotics as prescribed.  Take pain medication as directed for pain.  Your urine isn't sent for culture. If it is positive for anything be notified and directed on which antibiotic he should be taking.  Return to the emergency department for any high spiking fever, worsening pain not controlled with pain medication, persistent vomiting, gross blood in her urine, any other worsening or concerning symptoms.

## 2016-05-01 NOTE — ED Triage Notes (Signed)
Patient c/o left flank pain that started last night. Patient reports having fever and chills last night. Patient denies any urinary problems or vomiting or diarrhea. Pt has had nausea

## 2016-05-02 ENCOUNTER — Encounter (HOSPITAL_COMMUNITY): Payer: Self-pay | Admitting: Emergency Medicine

## 2016-05-02 ENCOUNTER — Inpatient Hospital Stay (HOSPITAL_COMMUNITY)
Admission: EM | Admit: 2016-05-02 | Discharge: 2016-05-05 | DRG: 872 | Disposition: A | Discharge status: Home / Self Care | Payer: BLUE CROSS/BLUE SHIELD | Attending: Internal Medicine | Admitting: Internal Medicine

## 2016-05-02 DIAGNOSIS — E876 Hypokalemia: Secondary | ICD-10-CM | POA: Diagnosis present

## 2016-05-02 DIAGNOSIS — Z825 Family history of asthma and other chronic lower respiratory diseases: Secondary | ICD-10-CM

## 2016-05-02 DIAGNOSIS — D729 Disorder of white blood cells, unspecified: Secondary | ICD-10-CM

## 2016-05-02 DIAGNOSIS — Z811 Family history of alcohol abuse and dependence: Secondary | ICD-10-CM

## 2016-05-02 DIAGNOSIS — N136 Pyonephrosis: Secondary | ICD-10-CM | POA: Diagnosis present

## 2016-05-02 DIAGNOSIS — N10 Acute tubulo-interstitial nephritis: Secondary | ICD-10-CM | POA: Diagnosis present

## 2016-05-02 DIAGNOSIS — Z87891 Personal history of nicotine dependence: Secondary | ICD-10-CM | POA: Diagnosis not present

## 2016-05-02 DIAGNOSIS — R000 Tachycardia, unspecified: Secondary | ICD-10-CM | POA: Diagnosis present

## 2016-05-02 DIAGNOSIS — Z9889 Other specified postprocedural states: Secondary | ICD-10-CM | POA: Diagnosis not present

## 2016-05-02 DIAGNOSIS — A4151 Sepsis due to Escherichia coli [E. coli]: Secondary | ICD-10-CM | POA: Diagnosis present

## 2016-05-02 DIAGNOSIS — A419 Sepsis, unspecified organism: Secondary | ICD-10-CM

## 2016-05-02 DIAGNOSIS — E669 Obesity, unspecified: Secondary | ICD-10-CM | POA: Diagnosis present

## 2016-05-02 DIAGNOSIS — N179 Acute kidney failure, unspecified: Secondary | ICD-10-CM

## 2016-05-02 DIAGNOSIS — R509 Fever, unspecified: Secondary | ICD-10-CM | POA: Diagnosis not present

## 2016-05-02 DIAGNOSIS — N390 Urinary tract infection, site not specified: Secondary | ICD-10-CM | POA: Diagnosis not present

## 2016-05-02 DIAGNOSIS — Z833 Family history of diabetes mellitus: Secondary | ICD-10-CM

## 2016-05-02 DIAGNOSIS — N12 Tubulo-interstitial nephritis, not specified as acute or chronic: Secondary | ICD-10-CM

## 2016-05-02 DIAGNOSIS — N135 Crossing vessel and stricture of ureter without hydronephrosis: Secondary | ICD-10-CM

## 2016-05-02 DIAGNOSIS — R112 Nausea with vomiting, unspecified: Secondary | ICD-10-CM

## 2016-05-02 DIAGNOSIS — Z9049 Acquired absence of other specified parts of digestive tract: Secondary | ICD-10-CM

## 2016-05-02 DIAGNOSIS — Z6834 Body mass index (BMI) 34.0-34.9, adult: Secondary | ICD-10-CM

## 2016-05-02 DIAGNOSIS — E0590 Thyrotoxicosis, unspecified without thyrotoxic crisis or storm: Secondary | ICD-10-CM | POA: Diagnosis present

## 2016-05-02 DIAGNOSIS — Z8249 Family history of ischemic heart disease and other diseases of the circulatory system: Secondary | ICD-10-CM

## 2016-05-02 DIAGNOSIS — I10 Essential (primary) hypertension: Secondary | ICD-10-CM | POA: Diagnosis present

## 2016-05-02 DIAGNOSIS — Z79899 Other long term (current) drug therapy: Secondary | ICD-10-CM

## 2016-05-02 DIAGNOSIS — R109 Unspecified abdominal pain: Secondary | ICD-10-CM

## 2016-05-02 LAB — COMPREHENSIVE METABOLIC PANEL
ALT: 54 U/L (ref 14–54)
AST: 35 U/L (ref 15–41)
Albumin: 4.4 g/dL (ref 3.5–5.0)
Alkaline Phosphatase: 67 U/L (ref 38–126)
Anion gap: 9 (ref 5–15)
BILIRUBIN TOTAL: 0.9 mg/dL (ref 0.3–1.2)
BUN: 9 mg/dL (ref 6–20)
CHLORIDE: 101 mmol/L (ref 101–111)
CO2: 25 mmol/L (ref 22–32)
CREATININE: 0.84 mg/dL (ref 0.44–1.00)
Calcium: 9 mg/dL (ref 8.9–10.3)
Glucose, Bld: 107 mg/dL — ABNORMAL HIGH (ref 65–99)
Potassium: 3.2 mmol/L — ABNORMAL LOW (ref 3.5–5.1)
Sodium: 135 mmol/L (ref 135–145)
TOTAL PROTEIN: 8.1 g/dL (ref 6.5–8.1)

## 2016-05-02 LAB — CBC WITH DIFFERENTIAL/PLATELET
Basophils Absolute: 0 10*3/uL (ref 0.0–0.1)
Basophils Relative: 0 %
EOS PCT: 0 %
Eosinophils Absolute: 0 10*3/uL (ref 0.0–0.7)
HEMATOCRIT: 36.1 % (ref 36.0–46.0)
Hemoglobin: 12.6 g/dL (ref 12.0–15.0)
LYMPHS ABS: 1.1 10*3/uL (ref 0.7–4.0)
LYMPHS PCT: 10 %
MCH: 29.2 pg (ref 26.0–34.0)
MCHC: 34.9 g/dL (ref 30.0–36.0)
MCV: 83.6 fL (ref 78.0–100.0)
MONO ABS: 0.9 10*3/uL (ref 0.1–1.0)
Monocytes Relative: 8 %
NEUTROS ABS: 9.1 10*3/uL — AB (ref 1.7–7.7)
Neutrophils Relative %: 82 %
PLATELETS: 196 10*3/uL (ref 150–400)
RBC: 4.32 MIL/uL (ref 3.87–5.11)
RDW: 12.2 % (ref 11.5–15.5)
WBC: 11.1 10*3/uL — ABNORMAL HIGH (ref 4.0–10.5)

## 2016-05-02 LAB — URINALYSIS, ROUTINE W REFLEX MICROSCOPIC
Bilirubin Urine: NEGATIVE
Glucose, UA: NEGATIVE mg/dL
Ketones, ur: NEGATIVE mg/dL
Nitrite: POSITIVE — AB
Protein, ur: NEGATIVE mg/dL
SPECIFIC GRAVITY, URINE: 1.011 (ref 1.005–1.030)
pH: 7 (ref 5.0–8.0)

## 2016-05-02 LAB — GC/CHLAMYDIA PROBE AMP (~~LOC~~) NOT AT ARMC
CHLAMYDIA, DNA PROBE: NEGATIVE
NEISSERIA GONORRHEA: NEGATIVE

## 2016-05-02 LAB — I-STAT CG4 LACTIC ACID, ED: Lactic Acid, Venous: 1.67 mmol/L (ref 0.5–1.9)

## 2016-05-02 LAB — I-STAT BETA HCG BLOOD, ED (MC, WL, AP ONLY): I-stat hCG, quantitative: <5 m[IU]/mL (ref <5)

## 2016-05-02 MED ORDER — SODIUM CHLORIDE 0.9 % IV BOLUS (SEPSIS)
500.0000 mL | Freq: Once | INTRAVENOUS | Status: AC
  Administered 2016-05-02: 500 mL via INTRAVENOUS

## 2016-05-02 MED ORDER — DEXTROSE 5 % IV SOLN
2.0000 g | Freq: Once | INTRAVENOUS | Status: AC
  Administered 2016-05-02: 2 g via INTRAVENOUS
  Filled 2016-05-02: qty 2

## 2016-05-02 MED ORDER — HYDROMORPHONE HCL 1 MG/ML IJ SOLN
1.0000 mg | INTRAMUSCULAR | Status: DC | PRN
  Administered 2016-05-02 – 2016-05-03 (×3): 1 mg via INTRAVENOUS
  Filled 2016-05-02 (×3): qty 1

## 2016-05-02 MED ORDER — POTASSIUM CHLORIDE CRYS ER 20 MEQ PO TBCR
40.0000 meq | EXTENDED_RELEASE_TABLET | Freq: Once | ORAL | Status: AC
  Administered 2016-05-02: 40 meq via ORAL
  Filled 2016-05-02: qty 2

## 2016-05-02 MED ORDER — MORPHINE SULFATE (PF) 4 MG/ML IV SOLN
4.0000 mg | Freq: Once | INTRAVENOUS | Status: AC
  Administered 2016-05-02: 4 mg via INTRAVENOUS
  Filled 2016-05-02: qty 1

## 2016-05-02 MED ORDER — ONDANSETRON HCL 4 MG/2ML IJ SOLN
4.0000 mg | Freq: Once | INTRAMUSCULAR | Status: AC
  Administered 2016-05-02: 4 mg via INTRAVENOUS
  Filled 2016-05-02: qty 2

## 2016-05-02 MED ORDER — IBUPROFEN 800 MG PO TABS
800.0000 mg | ORAL_TABLET | Freq: Once | ORAL | Status: AC
  Administered 2016-05-02: 800 mg via ORAL
  Filled 2016-05-02: qty 1

## 2016-05-02 MED ORDER — SODIUM CHLORIDE 0.9 % IV BOLUS (SEPSIS)
1000.0000 mL | Freq: Once | INTRAVENOUS | Status: AC
  Administered 2016-05-02: 1000 mL via INTRAVENOUS

## 2016-05-02 MED ORDER — DEXTROSE 5 % IV SOLN: 1.0000 g | INTRAVENOUS | Status: DC

## 2016-05-02 MED ORDER — HYDROMORPHONE HCL 1 MG/ML IJ SOLN
1.0000 mg | Freq: Once | INTRAMUSCULAR | Status: AC
  Administered 2016-05-02: 1 mg via INTRAVENOUS
  Filled 2016-05-02: qty 1

## 2016-05-02 NOTE — Consult Note (Signed)
New Urology Consult Note   Requesting Attending Physician:  Therisa Doyne, MD Service Providing Consult: Urology Consulting Attending: Laverle Patter, MD  Assessment:  Patient is a 31 y.o. female with history of anxiety, depression, hypertension presents with left flank pain and clinical picture consistent with left pyelonephritis. CT imaging appears to show left proximal ureteral dilation and pelviectasis which is mild. There is also a crossing vessel noted to the lower pole of the left kidney likely stemming from the left common iliac/aortic bifurcation area. She has had multiple CT scans including some in our system from 2014 and 2016 with no significant left proximal ureteral dilation noted. She has adequate left renal parenchymal and normal renal function. While she may have a very minor or subclinical left UPJ obstruction, we do not favor that this is the cause of her mild dilation currently and instead favor ureteritis with dilation likely secondary from bacterial endotoxins in the setting of her upper urinary tract infection. As such, we would not recommend surgical intervention.  Recommendations: -- Agree with medicine admission, no plan for urologic surgical intervention -- Agree with plans for IV antibiotics and IV fluid resuscitation -- Follow-up blood and urine cultures, tailor antibiotics appropriately -- Consider repeat imaging in approximately 72 hours if she is not clinically improving on IV antibiotics to rule out renal abscess -- Would favor deferring any further evaluation of possible minor left UPJ obstruction to the outpatient setting where possibility of Lasix renal scan can be discussed with the patient  Thank you for this consult. Please contact the urology consult pager with any further questions/concerns. Jalene Mullet, MD Urology Surgical Resident   HPI  Reason for Consult:  Left proximal ureteral dilation, pyelonephritis  Kristen Klein is seen in consultation  for reasons noted above at the request of Therisa Doyne, MD.  This is a 31 yo patient with a history of anxiety, depression, hypertension who was seen by Dr. Michael Boston 2014 with the [facility] for left abdominal pain and presumed urinary tract infection. At that visit she was noted to have a CT urogram, at least by report, with no significant abnormalities. Urine culture from that visit was no growth. Other than this visit she says she has never been seen by urologist including as a child. She has no history of GU surgeries.  She tells me that starting last Thursday, proximally 4 days ago, she started having left flank pain. This worsened again on Saturday. She was in the hospital yesterday for the same reason but discharged home. She has had some nausea with emesis 3 yesterday. She noticed some mild dysuria today. She denies urinary frequency. She does complain of urinary urgency. She had loose stools yesterday. She is also having headaches. Her pain primarily is in the left flank and does not radiate anywhere.  She called [facility] earlier today for stat referral but was referred to the emergency room as she was clinically feeling worse than she was yesterday. On arrival she had a temperature of 103. She has been tachycardic, sinus tachycardia on EKG, 110s on my visit. She has been hypertensive. She is received 2 g of ceftriaxone. She has normal renal function at 0.8. Her white cell count is 11.1. Urinalysis shows many bacteria, small leukocytes, positive nitrituria. Urine and blood cultures are pending. She is being admitted to medicine.   We were called specifically as the left proximal ureter does appear mildly dilated with a focal tapering point and a crossing vessel that may represent  a subclinical UPJ obstruction although previous CT imaging from 2014 at 2016 did not show any dilation of the left renal pelvis or left proximal ureter. There is adequate left cortical tissue with  no significant atrophy or thinning.   Past Medical History: Past Medical History:  Diagnosis Date  . Anxiety   . Depression   . Difficult intubation 12/09/2011  . Headache(784.0)   . Hypertension   . Migraine   . PFO (patent foramen ovale)   . Sterilization 12/09/2011  . Thyroid disease    hyperthyroid    Past Surgical History:  Past Surgical History:  Procedure Laterality Date  . CHOLECYSTECTOMY OPEN  2011  . CRYOTHERAPY    . DILATION AND EVACUATION  2009  . DILITATION & CURRETTAGE/HYSTROSCOPY WITH ESSURE  12/09/2011   Procedure: DILATATION & CURETTAGE/HYSTEROSCOPY WITH ESSURE;  Surgeon: Robley Fries, MD;  Location: [facility];  Service: Gynecology;  Laterality: N/A;  . TEE WITHOUT CARDIOVERSION N/A 08/13/2013   Procedure: TRANSESOPHAGEAL ECHOCARDIOGRAM (TEE);  Surgeon: Pamella Pert, MD;  Location: [facility] ENDOSCOPY;  Service: Cardiovascular;  Laterality: N/A;  . WISDOM TOOTH EXTRACTION  2010    Medication: Current Facility-Administered Medications  Medication Dose Route Frequency Provider Last Rate Last Dose  . [START ON 05/03/2016] cefTRIAXone (ROCEPHIN) 1 g in dextrose 5 % 50 mL IVPB  1 g Intravenous Q24H Loren Racer, MD      . HYDROmorphone (DILAUDID) injection 1 mg  1 mg Intravenous Q2H PRN [address], PA-C   1 mg at 05/02/16 2032  . sodium chloride 0.9 % bolus 500 mL  500 mL Intravenous Once Therisa Doyne, MD       Current Outpatient Prescriptions  Medication Sig Dispense Refill  . Biotin 1000 MCG tablet Take 5,000 mcg by mouth daily.     . cholecalciferol (VITAMIN D) 1000 units tablet Take 2,000 Units by mouth daily.    Marland Kitchen FLUoxetine (PROZAC) 20 MG capsule Take 20 mg by mouth at bedtime.   6  . hydrochlorothiazide (HYDRODIURIL) 25 MG tablet Take 25 mg by mouth at bedtime.   1  . ibuprofen (ADVIL,MOTRIN) 200 MG tablet Take 600-800 mg by mouth every 6 (six) hours as needed for moderate pain.     Marland Kitchen irbesartan (AVAPRO) 75 MG tablet Take 75 mg by mouth daily.   6  . oxyCODONE-acetaminophen (PERCOCET/ROXICET) 5-325 MG tablet Take 2 tablets by mouth every 4 (four) hours as needed for severe pain. 10 tablet 0  . zolpidem (AMBIEN) 5 MG tablet Take 5 mg by mouth at bedtime as needed for sleep.  1  . ALPRAZolam (XANAX) 0.5 MG tablet Take 0.5 mg by mouth 2 (two) times daily as needed for anxiety.     . cephALEXin (KEFLEX) 500 MG capsule Take 1 capsule (500 mg total) by mouth 4 (four) times daily. 20 capsule 0    Allergies: No Known Allergies  Social History: Social History  Substance Use Topics  . Smoking status: Former Smoker    Packs/day: 0.15    Years: 2.00    Types: Cigarettes    Quit date: 01/11/2004  . Smokeless tobacco: Never Used  . Alcohol use 0.0 oz/week    3 - 5 Glasses of wine per week     Comment: occasionally    Family History Family History  Problem Relation Age of Onset  . Hypertension Mother   . Hypothyroidism Mother   . Hypertension Father   . Alcohol abuse Sister   . Asthma Son   .  Allergies Son   . Diabetes Paternal Grandmother     Review of Systems 10 systems were reviewed and are negative except as noted specifically in the HPI.  Objective   Vital signs in last 24 hours: BP 112/62   Pulse (!) 123   Temp 99 F (37.2 C) (Oral)   Resp 18   Ht 5' (1.524 m)   Wt 79.4 kg (175 lb)   SpO2 98%   BMI 34.18 kg/m   Intake/Output last 3 shifts: I/O last 3 completed shifts: In: 1050 [IV Piggyback:1050] Out: -   Physical Exam General: NAD, A&O, resting, appears uncomfortable HEENT: [place]/AT, EOMI, MMM Pulmonary: Normal work of breathing on RA Cardiovascular: tachycardic, HDS, adequate peripheral perfusion Abdomen: soft, TTP in left flank, nondistended, no suprapubic fullness or tenderness GU: no Foley present, + left CVA tenderness Extremities: warm and well perfused, no edema Neuro: Appropriate, no focal neurological deficits  Most Recent Labs: Lab Results  Component Value Date   WBC 11.1 (H) 05/02/2016    HGB 12.6 05/02/2016   HCT 36.1 05/02/2016   PLT 196 05/02/2016    Lab Results  Component Value Date   NA 135 05/02/2016   K 3.2 (L) 05/02/2016   CL 101 05/02/2016   CO2 25 05/02/2016   BUN 9 05/02/2016   CREATININE 0.84 05/02/2016   CALCIUM 9.0 05/02/2016    Lab Results  Component Value Date   ALKPHOS 67 05/02/2016   BILITOT 0.9 05/02/2016   PROT 8.1 05/02/2016   ALBUMIN 4.4 05/02/2016   ALT 54 05/02/2016   AST 35 05/02/2016    No results found for: INR, APTT   Urine Culture: Pending   IMAGING: Ct Renal Stone Study  Result Date: 05/01/2016 CLINICAL DATA:  Left flank pain beginning last night. Fever and chills. EXAM: CT ABDOMEN AND PELVIS WITHOUT CONTRAST TECHNIQUE: Multidetector CT imaging of the abdomen and pelvis was performed following the standard protocol without IV contrast. COMPARISON:  03/26/2014 FINDINGS: Lower chest: The visualized lung bases are clear. Hepatobiliary: Diffusely decreased attenuation of the liver consistent with steatosis. Prior cholecystectomy. No biliary dilatation. Pancreas: Unremarkable. Spleen: Unremarkable. Adrenals/Urinary Tract: Unremarkable adrenal glands. Punctate nonobstructing calculus in the lower pole of the left kidney (series 5, image 89). Two suspected punctate nonobstructing calculi in the interpolar region/lower pole of the right kidney. No right-sided hydronephrosis. No left-sided caliectasis, however there is mild left pelviectasis and moderate proximal hydroureter. The left ureter demonstrates an abrupt transition point in its proximal to midportion and is decompressed distally (series 2, image 41 and series 6, image 116). The proximal ureter is mildly prominent on multiple prior studies with an abrupt tapering at the same level, although proximal dilatation is greater on today's examination. No ureteral calculi are identified. There is an accessory renal vein draining the lower pole of the left kidney which crosses posterior to the  ureter at the transition point, with the gonadal vein being immediately anterior to the ureter at this level. No perinephric stranding. The bladder is unremarkable. Stomach/Bowel: The stomach is within normal limits. There is no evidence of bowel obstruction or inflammation. The appendix is unremarkable. Vascular/Lymphatic: Normal caliber of the abdominal aorta. No enlarged lymph nodes. Reproductive: Unremarkable uterus and left ovary. 2.4 cm right adnexal cyst has a benign appearance. Prior Essure sterilization. Other: No intraperitoneal free fluid. No abdominal wall mass or hernia. Musculoskeletal: No acute osseous abnormality or suspicious osseous lesion. IMPRESSION: 1. Mild left pelviectasis and proximal hydroureter without obstructing stone identified. This  could reflect recent stone passage or infection. The proximal to mid ureter abruptly transitions in caliber with a similar though less prominent appearance on multiple prior examinations. This could reflect an underlying stricture or compression between the gonadal vein and an accessory renal vein. Contrast-enhanced hematuria protocol CT is recommended for further evaluation but may be performed as an outpatient unless more immediate evaluation is clinically necessary. 2. Hepatic steatosis. Electronically Signed   By: Sebastian Ache M.D.   On: 05/01/2016 13:30

## 2016-05-02 NOTE — ED Notes (Signed)
Bed: WA01 Expected date:  Expected time:  Means of arrival:  Comments: TRIAGE 3

## 2016-05-02 NOTE — ED Triage Notes (Signed)
Patient reports severe left sided flank pain, nausea, chills, headache, and fever. Patient seen here yesterday and dx with kidney stone and possible UTI. Patient last had percocet at 11am today.

## 2016-05-02 NOTE — H&P (Signed)
Kristen Klein FAO:[id number] DOB: July 17, 1985 DOA: 05/02/2016     PCP: Robley Fries, MD   Outpatient Specialists: Urology Patient coming from:    home Lives  With family    Chief Complaint: back pain fever  HPI: Kristen Klein is a 31 y.o. female with medical history significant of an anxiety depression hypertension PFO thyroid disease ureteral stricture    Presented with 5 days of left-sided flank pain radiates to the lower abdomen pain has worsened 2 days ago. It's sharp and shoots down her flank. Worse with any kind of movement. She have had nausea vomiting fever and chills started 2 days ago. At home with temperature up to 102 she attempted to use ibuprofen but it did not seem to help and was seen for this in emergency department yesterday and is found to have UTI pelvic exam was unremarkable CT renal scan with no stone but there is some ureteral dilatation with questionable stricture with plan was to have outpatient CT to confirm this. She was discharged home on antibiotics Keflex (never had a chance to pick it up it was not available at the pharmacy. Also was given prescription for oxycodone ( was able to fill). Patient continued to do much worse she called urology who asked her to go to emergency department. Patient returned today with severe left-sided flank pain and nausea chills headache and fever. Her urine has been having strong smell of fever up to 103.6 she continued to vomit her pain has persisted and gotten worse. Percocet did not seem to help. Last nigh she had once episode of diarrhea and it self resolved.   Patient used to be seeing Urology for problems voiding years ago. Last UTI was 2 months she gets them once or twice a year.     IN ER:  Temp (24hrs), Avg:100.5 F (38.1 C), Min:99 F (37.2 C), Max:103.1 F (39.5 C)     RR 21 98% Hr 135 Bp 128/70  WBC 11.1 hg 12.6 Na 135 K 3.2 Lactic acid 1.67 Meeting sepsis criteria with fever tachycardia elevated  white blood cell count and respirations Following Medications were ordered in ER: Medications  cefTRIAXone (ROCEPHIN) 1 g in dextrose 5 % 50 mL IVPB (not administered)  HYDROmorphone (DILAUDID) injection 1 mg (1 mg Intravenous Given 05/02/16 2032)  sodium chloride 0.9 % bolus 1,000 mL (0 mLs Intravenous Stopped 05/02/16 1848)    And  sodium chloride 0.9 % bolus 1,000 mL (0 mLs Intravenous Stopped 05/02/16 1945)    And  sodium chloride 0.9 % bolus 500 mL (0 mLs Intravenous Stopped 05/02/16 2032)  cefTRIAXone (ROCEPHIN) 2 g in dextrose 5 % 50 mL IVPB (0 g Intravenous Stopped 05/02/16 1758)  ondansetron (ZOFRAN) injection 4 mg (4 mg Intravenous Given 05/02/16 1721)  morphine 4 MG/ML injection 4 mg (4 mg Intravenous Given 05/02/16 1722)  ibuprofen (ADVIL,MOTRIN) tablet 800 mg (800 mg Oral Given 05/02/16 1727)  HYDROmorphone (DILAUDID) injection 1 mg (1 mg Intravenous Given 05/02/16 1752)  potassium chloride SA (K-DUR,KLOR-CON) CR tablet 40 mEq (40 mEq Oral Given 05/02/16 1944)     ER provider discussed case with: Urology who will see patient in consult  Hospitalist was called for admission for sepsis secondary to pyelonephritis  Review of Systems:    Pertinent positives include: Fevers, chills, fatigue,  dysuria, change in color of urine, urgency or frequency.  Constitutional:  No weight loss, night sweats, weight loss  HEENT:  No headaches, Difficulty swallowing,Tooth/dental problems,Sore throat,  No sneezing, itching, ear ache, nasal congestion, post nasal drip,  Cardio-vascular:  No chest pain, Orthopnea, PND, anasarca, dizziness, palpitations.no Bilateral lower extremity swelling  GI:  No heartburn, indigestion, abdominal pain, nausea, vomiting, diarrhea, change in bowel habits, loss of appetite, melena, blood in stool, hematemesis Resp:  no shortness of breath at rest. No dyspnea on exertion, No excess mucus, no productive cough, No non-productive cough, No coughing up of blood.No change  in color of mucus.No wheezing. Skin:  no rash or lesions. No jaundice GU:   no  No straining to urinate.  No flank pain.  Musculoskeletal:  No joint pain or no joint swelling. No decreased range of motion. No back pain.  Psych:  No change in mood or affect. No depression or anxiety. No memory loss.  Neuro: no localizing neurological complaints, no tingling, no weakness, no double vision, no gait abnormality, no slurred speech, no confusion  As per HPI otherwise 10 point review of systems negative.   Past Medical History: Past Medical History:  Diagnosis Date  . Anxiety   . Depression   . Difficult intubation 12/09/2011  . Headache(784.0)   . Hypertension   . Migraine   . PFO (patent foramen ovale)   . Sterilization 12/09/2011  . Thyroid disease    hyperthyroid   Past Surgical History:  Procedure Laterality Date  . CHOLECYSTECTOMY OPEN  2011  . CRYOTHERAPY    . DILATION AND EVACUATION  2009  . DILITATION & CURRETTAGE/HYSTROSCOPY WITH ESSURE  12/09/2011   Procedure: DILATATION & CURETTAGE/HYSTEROSCOPY WITH ESSURE;  Surgeon: Robley Fries, MD;  Location: [facility];  Service: Gynecology;  Laterality: N/A;  . TEE WITHOUT CARDIOVERSION N/A 08/13/2013   Procedure: TRANSESOPHAGEAL ECHOCARDIOGRAM (TEE);  Surgeon: Pamella Pert, MD;  Location: [facility] ENDOSCOPY;  Service: Cardiovascular;  Laterality: N/A;  . WISDOM TOOTH EXTRACTION  2010     Social History:  Ambulatory   Independently    reports that she quit smoking about 12 years ago. Her smoking use included Cigarettes. She has a 0.30 pack-year smoking history. She has never used smokeless tobacco. She reports that she drinks alcohol. She reports that she does not use drugs.  Allergies:  No Known Allergies     Family History:   Family History  Problem Relation Age of Onset  . Hypertension Mother   . Hypothyroidism Mother   . Hypertension Father   . Alcohol abuse Sister   . Asthma Son   . Allergies Son   . Diabetes  Paternal Grandmother     Medications: Prior to Admission medications   Medication Sig Start Date End Date Taking? Authorizing Provider  Biotin 1000 MCG tablet Take 5,000 mcg by mouth daily.    Yes Historical Provider, MD  cholecalciferol (VITAMIN D) 1000 units tablet Take 2,000 Units by mouth daily.   Yes Historical Provider, MD  FLUoxetine (PROZAC) 20 MG capsule Take 20 mg by mouth at bedtime.  11/17/15  Yes Historical Provider, MD  hydrochlorothiazide (HYDRODIURIL) 25 MG tablet Take 25 mg by mouth at bedtime.  11/03/15  Yes Historical Provider, MD  ibuprofen (ADVIL,MOTRIN) 200 MG tablet Take 600-800 mg by mouth every 6 (six) hours as needed for moderate pain.    Yes Historical Provider, MD  irbesartan (AVAPRO) 75 MG tablet Take 75 mg by mouth daily. 03/22/16  Yes Historical Provider, MD  oxyCODONE-acetaminophen (PERCOCET/ROXICET) 5-325 MG tablet Take 2 tablets by mouth every 4 (four) hours as needed for severe pain. 05/01/16  Yes Early Chars  Layden, PA-C  zolpidem (AMBIEN) 5 MG tablet Take 5 mg by mouth at bedtime as needed for sleep. 10/07/15  Yes Historical Provider, MD  ALPRAZolam Prudy Feeler) 0.5 MG tablet Take 0.5 mg by mouth 2 (two) times daily as needed for anxiety.     Historical Provider, MD  cephALEXin (KEFLEX) 500 MG capsule Take 1 capsule (500 mg total) by mouth 4 (four) times daily. 05/01/16   Maxwell Caul, PA-C    Physical Exam: Patient Vitals for the past 24 hrs:  BP Temp Temp src Pulse Resp SpO2 Height Weight  05/02/16 1920 128/70 99 F (37.2 C) Oral (!) 135 (!) 21 98 % - -  05/02/16 1900 (!) 152/82 - - (!) 138 (!) 28 98 % - -  05/02/16 1848 128/75 99.5 F (37.5 C) Oral (!) 133 (!) 28 98 % - -  05/02/16 1830 128/75 - - (!) 130 (!) 31 98 % - -  05/02/16 1800 130/70 - - (!) 137 18 99 % - -  05/02/16 1755 125/74 - - (!) 137 18 100 % - -  05/02/16 1633 138/89 - - (!) 140 - 96 % - -  05/02/16 1623 - - - - - - 5' (1.524 m) 79.4 kg (175 lb)  05/02/16 1618 (!) 163/106 (!) 103.1 F  (39.5 C) Oral (!) 157 20 100 % - -    1. General:  in No Acute distress 2. Psychological: Alert and   Oriented 3. Head/ENT:     Dry Mucous Membranes                          Head Non traumatic, neck supple                          Normal    Dentition 4. SKIN:  decreased Skin turgor,  Skin clean Dry and intact no rash 5. Heart: rapid Regular rate and rhythm no Murmur, Rub or gallop 6. Lungs:  Clear to auscultation bilaterally, no wheezes or crackles   7. Abdomen: Left flank tenderness Abd: Soft,  non-tender, Non distended 8. Lower extremities: no clubbing, cyanosis, or edema 9. Neurologically Grossly intact, moving all 4 extremities equally  10. MSK: Normal range of motion   body mass index is 34.18 kg/m.  Labs on Admission:   Labs on Admission: I have personally reviewed following labs and imaging studies  CBC:  Recent Labs Lab 05/01/16 1120 05/02/16 1739  WBC 10.4 11.1*  NEUTROABS 6.9 9.1*  HGB 13.4 12.6  HCT 37.3 36.1  MCV 84.8 83.6  PLT 234 196   Basic Metabolic Panel:  Recent Labs Lab 05/01/16 1120 05/02/16 1739  NA 135 135  K 3.3* 3.2*  CL 101 101  CO2 25 25  GLUCOSE 103* 107*  BUN 8 9  CREATININE 0.72 0.84  CALCIUM 8.9 9.0   GFR: Estimated Creatinine Clearance: 91.4 mL/min (by C-G formula based on SCr of 0.84 mg/dL). Liver Function Tests:  Recent Labs Lab 05/02/16 1739  AST 35  ALT 54  ALKPHOS 67  BILITOT 0.9  PROT 8.1  ALBUMIN 4.4    Recent Labs Lab 05/01/16 1120  LIPASE 19   No results for input(s): AMMONIA in the last 168 hours. Coagulation Profile: No results for input(s): INR, PROTIME in the last 168 hours. Cardiac Enzymes: No results for input(s): CKTOTAL, CKMB, CKMBINDEX, TROPONINI in the last 168 hours. BNP (last 3 results) No  results for input(s): PROBNP in the last 8760 hours. HbA1C: No results for input(s): HGBA1C in the last 72 hours. CBG: No results for input(s): GLUCAP in the last 168 hours. Lipid Profile: No  results for input(s): CHOL, HDL, LDLCALC, TRIG, CHOLHDL, LDLDIRECT in the last 72 hours. Thyroid Function Tests: No results for input(s): TSH, T4TOTAL, FREET4, T3FREE, THYROIDAB in the last 72 hours. Anemia Panel: No results for input(s): VITAMINB12, FOLATE, FERRITIN, TIBC, IRON, RETICCTPCT in the last 72 hours. Urine analysis:    Component Value Date/Time   COLORURINE YELLOW 05/02/2016 1625   APPEARANCEUR CLEAR 05/02/2016 1625   LABSPEC 1.011 05/02/2016 1625   PHURINE 7.0 05/02/2016 1625   GLUCOSEU NEGATIVE 05/02/2016 1625   HGBUR MODERATE (A) 05/02/2016 1625   BILIRUBINUR NEGATIVE 05/02/2016 1625   BILIRUBINUR neg 07/04/2012 1838   KETONESUR NEGATIVE 05/02/2016 1625   PROTEINUR NEGATIVE 05/02/2016 1625   UROBILINOGEN 0.2 03/26/2014 1055   NITRITE POSITIVE (A) 05/02/2016 1625   LEUKOCYTESUR SMALL (A) 05/02/2016 1625   Sepsis Labs: (procalcitonin:4,lacticidven:4) ) Recent Results (from the past 240 hour(s))  Wet prep, genital     Status: Abnormal   Collection Time: 05/01/16 12:09 PM  Result Value Ref Range Status   Yeast Wet Prep HPF POC NONE SEEN NONE SEEN Final   Trich, Wet Prep NONE SEEN NONE SEEN Final   Clue Cells Wet Prep HPF POC NONE SEEN NONE SEEN Final   WBC, Wet Prep HPF POC MANY (A) NONE SEEN Final   Sperm NONE SEEN  Final      UA   evidence of UTI    Lab Results  Component Value Date   HGBA1C 5.6 01/14/2014    Estimated Creatinine Clearance: 91.4 mL/min (by C-G formula based on SCr of 0.84 mg/dL).  BNP (last 3 results) No results for input(s): PROBNP in the last 8760 hours.   ECG REPORT  Independently reviewed Rate: 136  Rhythm: Sinus tachycardia ST&T Change: No acute ischemic changes  QTC 444  Filed Weights   05/02/16 1623  Weight: 79.4 kg (175 lb)     Cultures: No results found for: SDES, SPECREQUEST, CULT, REPTSTATUS   Radiological Exams on Admission: Ct Renal Stone Study  Result Date: 05/01/2016 CLINICAL DATA:  Left flank  pain beginning last night. Fever and chills. EXAM: CT ABDOMEN AND PELVIS WITHOUT CONTRAST TECHNIQUE: Multidetector CT imaging of the abdomen and pelvis was performed following the standard protocol without IV contrast. COMPARISON:  03/26/2014 FINDINGS: Lower chest: The visualized lung bases are clear. Hepatobiliary: Diffusely decreased attenuation of the liver consistent with steatosis. Prior cholecystectomy. No biliary dilatation. Pancreas: Unremarkable. Spleen: Unremarkable. Adrenals/Urinary Tract: Unremarkable adrenal glands. Punctate nonobstructing calculus in the lower pole of the left kidney (series 5, image 89). Two suspected punctate nonobstructing calculi in the interpolar region/lower pole of the right kidney. No right-sided hydronephrosis. No left-sided caliectasis, however there is mild left pelviectasis and moderate proximal hydroureter. The left ureter demonstrates an abrupt transition point in its proximal to midportion and is decompressed distally (series 2, image 41 and series 6, image 116). The proximal ureter is mildly prominent on multiple prior studies with an abrupt tapering at the same level, although proximal dilatation is greater on today's examination. No ureteral calculi are identified. There is an accessory renal vein draining the lower pole of the left kidney which crosses posterior to the ureter at the transition point, with the gonadal vein being immediately anterior to the ureter at this level. No perinephric stranding. The bladder is  unremarkable. Stomach/Bowel: The stomach is within normal limits. There is no evidence of bowel obstruction or inflammation. The appendix is unremarkable. Vascular/Lymphatic: Normal caliber of the abdominal aorta. No enlarged lymph nodes. Reproductive: Unremarkable uterus and left ovary. 2.4 cm right adnexal cyst has a benign appearance. Prior Essure sterilization. Other: No intraperitoneal free fluid. No abdominal wall mass or hernia. Musculoskeletal:  No acute osseous abnormality or suspicious osseous lesion. IMPRESSION: 1. Mild left pelviectasis and proximal hydroureter without obstructing stone identified. This could reflect recent stone passage or infection. The proximal to mid ureter abruptly transitions in caliber with a similar though less prominent appearance on multiple prior examinations. This could reflect an underlying stricture or compression between the gonadal vein and an accessory renal vein. Contrast-enhanced hematuria protocol CT is recommended for further evaluation but may be performed as an outpatient unless more immediate evaluation is clinically necessary. 2. Hepatic steatosis. Electronically Signed   By: Sebastian Ache M.D.   On: 05/01/2016 13:30    Chart has been reviewed    Assessment/Plan   31 y.o. female with medical history significant of an anxiety depression hypertension PFO thyroid disease ureteral stricture admitted for sepsis due to peylonephritis  Present on Admission: . Acute lower UTI treated with Rocephin, obtain urine culture and adjust medications as needed . Hypokalemia replace check magnesium level . Acute pyelonephritis - treat with Rocephin supportive measures rehydrate, discussed of urology will see in consult and I'll keep nothing by mouth until have decided whether patient would benefit from stenting given mild hydronephrosis. . Sepsis (HCC) Admit per Sepsis protocol likely source being  UTI,    - rehydrate with 93ml/kg  - initiate ceftriaxone IV  -  obtain blood cultures  - Obtain serial lactic acid  - Obtain procalcitonin level  - Admit and monitor vital signs closely    Sepsis - Repeat Assessment  Performed at:    9 pm  Vitals     Blood pressure 112/62, pulse (!) 123, temperature 99 F (37.2 C), temperature source Oral, resp. rate 18, height 5' (1.524 m), weight 79.4 kg (175 lb), SpO2 98 %.  Heart:     Tachycardic  Lungs:    CTA  Capillary Refill:   <2 sec  Peripheral  Pulse:   Radial pulse palpable  Skin:     Normal Color     Other plan as per orders.  AK in the setting of pyelonephritis sepsis and mild unilateral hydronephrosis with possible mild stricture hold ARB Rehydrate defer to urology regarding need for stenting  follow Cr OBTAIN URINE STUDIES DVT prophylaxis:  SCD    Code Status:  FULL CODE   as per patient    Family Communication:   Family  at  Bedside  plan of care was discussed with  Sister,   mother  Disposition Plan:    To home once workup is complete and patient is stable     Consults called: Urology  Admission status:    inpatient      Level of care     SDU given severity of tachycardia if patient improves can change to telemetry     I have spent a total of 66 min on this admission   extra time was spent to discuss case with urology  Kristen Klein 05/02/2016, 9:26 PM    [facility]  Pager [phone]   after 2 AM please page floor coverage PA If 7AM-7PM, please contact the day team taking care of the patient  [email or web address]  Password [email or web address]

## 2016-05-02 NOTE — ED Provider Notes (Signed)
[facility]-EMERGENCY DEPT Provider Note   CSN: [id number] Arrival date & time: 05/02/16  1559     History   Chief Complaint Chief Complaint  Patient presents with  . Flank Pain    Left  . Fever    HPI Kristen Klein is a 31 y.o. female with a PMHx of HTN, hyperthyroidism, headaches, PFO, and PSHx of cholecystectomy, who presents to the ED with complaints of gradually worsening left flank pain that originally began 4 days ago but worsened last night. Chart review reveals she was seen in the ED yesterday for this issue, had CT that showed ?recently passed stone and ?ureteral stricture, U/A showed many WBCs, otherwise labs were unremarkable, she was treated for UTI/pyelo and given percocet and keflex to go home with. She never ended up starting the antibiotic because she was unable to get back to the pharmacy to fill it, however today she continues to have flank pain as well as fevers with Tmax 103.6, nausea, 2 episodes of nonbloody nonbilious emesis yesterday, chills, urinary urgency and hesitancy, dysuria, and cloudy malodorous urine. She also reports one episode of nonbloody diarrhea last night but none since then. She describes her pain as 10/10 constant stabbing Left flank pain radiating to the left abdomen worse with palpation and movement and unrelieved with Percocet and ibuprofen. Last ibuprofen dose was around 3 AM, Percocet taken at 11 AM. She reports that her PCP Dr. Linward Natal at [facility] put in a stat consult for urology but the nurse from [facility] urology called her and told her to come here, given her symptoms. She was previously seen at [facility] urology many years ago, has not seen them recently, and can't recall the name of the physician she saw in the past.   She denies cough, URI symptoms, CP, SOB, ongoing diarrhea, constipation, obstipation, melena, hematochezia, hematemesis, hematuria, vaginal bleeding/discharge, myalgias, arthralgias, numbness, tingling, focal weakness, or any other  complaints at this time. She denies recent travel, sick contacts, or suspicious food intake. Reports social EtOH use, last use on Wednesday. Reports occasional NSAID use ~3x/month. Has irregular menstrual cycles, takes medication to "bring on a period", LMP was in February and she is due this month to take the medication to bring her period on. PCP is Dr. Linward Natal at [facility].    The history is provided by the patient and medical records. No language interpreter was used.  Flank Pain  This is a new problem. The current episode started more than 2 days ago. The problem occurs constantly. The problem has been gradually worsening. Associated symptoms include abdominal pain. Pertinent negatives include no chest pain and no shortness of breath. The symptoms are aggravated by walking (movement and palpation). Nothing relieves the symptoms. Treatments tried: ibuprofen , percocet . The treatment provided no relief.  Fever   Associated symptoms include diarrhea (x1 yesterday, none since) and vomiting. Pertinent negatives include no chest pain and no cough.    Past Medical History:  Diagnosis Date  . Anxiety   . Depression   . Difficult intubation 12/09/2011  . Headache(784.0)   . Hypertension   . Migraine   . PFO (patent foramen ovale)   . Sterilization 12/09/2011  . Thyroid disease    hyperthyroid    Patient Active Problem List   Diagnosis Date Noted  . Concussion with no loss of consciousness 10/17/2013  . Sterilization 12/09/2011  . Difficult intubation 12/09/2011    Past Surgical History:  Procedure Laterality Date  . CHOLECYSTECTOMY OPEN  2011  .  CRYOTHERAPY    . DILATION AND EVACUATION  2009  . DILITATION & CURRETTAGE/HYSTROSCOPY WITH ESSURE  12/09/2011   Procedure: DILATATION & CURETTAGE/HYSTEROSCOPY WITH ESSURE;  Surgeon: Robley Fries, MD;  Location: [facility] ORS;  Service: Gynecology;  Laterality: N/A;  . TEE WITHOUT CARDIOVERSION N/A 08/13/2013   Procedure: TRANSESOPHAGEAL  ECHOCARDIOGRAM (TEE);  Surgeon: Pamella Pert, MD;  Location: [facility] ENDOSCOPY;  Service: Cardiovascular;  Laterality: N/A;  . WISDOM TOOTH EXTRACTION  2010    OB History    Gravida Para Term Preterm AB Living   3 2 1 1 1 2    SAB TAB Ectopic Multiple Live Births   1 0 0 0 1       Home Medications    Prior to Admission medications   Medication Sig Start Date End Date Taking? Authorizing Provider  ALPRAZolam Prudy Feeler) 0.5 MG tablet Take 0.5 mg by mouth 2 (two) times daily as needed for anxiety.     Historical Provider, MD  Biotin 1000 MCG tablet Take 5,000 mcg by mouth 3 (three) times daily.    Historical Provider, MD  cephALEXin (KEFLEX) 500 MG capsule Take 1 capsule (500 mg total) by mouth 4 (four) times daily. 05/01/16   Maxwell Caul, PA-C  cholecalciferol (VITAMIN D) 1000 units tablet Take 2,000 Units by mouth daily.    Historical Provider, MD  etodolac (LODINE) 400 MG tablet Take 400 mg by mouth 2 (two) times daily. 04/18/16   Historical Provider, MD  FLUoxetine (PROZAC) 20 MG capsule Take 20 mg by mouth at bedtime.  11/17/15   Historical Provider, MD  hydrochlorothiazide (HYDRODIURIL) 25 MG tablet Take 25 mg by mouth at bedtime.  11/03/15   Historical Provider, MD  ibuprofen (ADVIL,MOTRIN) 200 MG tablet Take 800 mg by mouth every 6 (six) hours as needed.    Historical Provider, MD  irbesartan (AVAPRO) 75 MG tablet Take 75 mg by mouth daily. 03/22/16   Historical Provider, MD  oxyCODONE-acetaminophen (PERCOCET/ROXICET) 5-325 MG tablet Take 2 tablets by mouth every 4 (four) hours as needed for severe pain. 05/01/16   Maxwell Caul, PA-C  zolpidem (AMBIEN) 5 MG tablet Take 5 mg by mouth at bedtime as needed for sleep. 10/07/15   Historical Provider, MD    Family History Family History  Problem Relation Age of Onset  . Hypertension Mother   . Hypothyroidism Mother   . Hypertension Father   . Alcohol abuse Sister   . Asthma Son   . Allergies Son   . Diabetes Paternal Grandmother       Social History Social History  Substance Use Topics  . Smoking status: Former Smoker    Packs/day: 0.15    Years: 2.00    Types: Cigarettes    Quit date: 01/11/2004  . Smokeless tobacco: Never Used  . Alcohol use 0.0 oz/week    3 - 5 Glasses of wine per week     Comment: occasionally     Allergies   Patient has no known allergies.   Review of Systems Review of Systems  Constitutional: Positive for chills and fever.  HENT: Negative for rhinorrhea.   Respiratory: Negative for cough and shortness of breath.   Cardiovascular: Negative for chest pain.  Gastrointestinal: Positive for abdominal pain, diarrhea (x1 yesterday, none since), nausea and vomiting. Negative for blood in stool and constipation.  Genitourinary: Positive for dysuria, flank pain and urgency. Negative for frequency, hematuria, vaginal bleeding and vaginal discharge.       +Malodorous cloudy  urine +urinary hesitancy  Musculoskeletal: Negative for arthralgias and myalgias.  Skin: Negative for color change.  Allergic/Immunologic: Negative for immunocompromised state.  Neurological: Negative for weakness and numbness.  Psychiatric/Behavioral: Negative for confusion.   All other systems reviewed and are negative for acute change except as noted in the HPI.    Physical Exam Updated Vital Signs BP 138/89   Pulse (!) 140   Temp (!) 103.1 F (39.5 C) (Oral)   Resp 20   Ht 5' (1.524 m)   Wt 79.4 kg   SpO2 96%   BMI 34.18 kg/m    Physical Exam  Constitutional: She is oriented to person, place, and time. She appears well-developed and well-nourished.  Non-toxic appearance. She appears distressed.  Febrile 103.1, nontoxic, but appears uncomfortable, leaning to the right while in bed  HENT:  Head: Normocephalic and atraumatic.  Mouth/Throat: Oropharynx is clear and moist. Mucous membranes are dry (mildly).  Eyes: Conjunctivae and EOM are normal. Right eye exhibits no discharge. Left eye exhibits no  discharge.  Neck: Normal range of motion. Neck supple.  Cardiovascular: Regular rhythm, normal heart sounds and intact distal pulses.  Tachycardia present.  Exam reveals no gallop and no friction rub.   No murmur heard. Mildly tachycardic in 120-130s  Pulmonary/Chest: Effort normal and breath sounds normal. No respiratory distress. She has no decreased breath sounds. She has no wheezes. She has no rhonchi. She has no rales.  Abdominal: Soft. Normal appearance and bowel sounds are normal. She exhibits no distension. There is tenderness in the left upper quadrant. There is CVA tenderness. There is no rigidity, no rebound, no guarding, no tenderness at McBurney's point and negative Murphy's sign.    Soft, nondistended, +BS throughout, with moderate LUQ TTP and exquisite L flank TTP, no r/g/r, neg murphy's, neg mcburney's, +L sided CVA TTP   Musculoskeletal: Normal range of motion.  Neurological: She is alert and oriented to person, place, and time. She has normal strength. No sensory deficit.  Skin: Skin is warm, dry and intact. No rash noted.  Psychiatric: She has a normal mood and affect.  Nursing note and vitals reviewed.    ED Treatments / Results  Labs (all labs ordered are listed, but only abnormal results are displayed) Labs Reviewed  URINALYSIS, ROUTINE W REFLEX MICROSCOPIC - Abnormal; Notable for the following:       Result Value   Hgb urine dipstick MODERATE (*)    Nitrite POSITIVE (*)    Leukocytes, UA SMALL (*)    Bacteria, UA MANY (*)    Squamous Epithelial / LPF 0-5 (*)    All other components within normal limits  CBC WITH DIFFERENTIAL/PLATELET - Abnormal; Notable for the following:    WBC 11.1 (*)    Neutro Abs 9.1 (*)    All other components within normal limits  COMPREHENSIVE METABOLIC PANEL - Abnormal; Notable for the following:    Potassium 3.2 (*)    Glucose, Bld 107 (*)    All other components within normal limits  URINE CULTURE  CULTURE, BLOOD (ROUTINE X 2)    CULTURE, BLOOD (ROUTINE X 2)  I-STAT BETA HCG BLOOD, ED ([facility], [facility], [facility] ONLY)  I-STAT CG4 LACTIC ACID, ED    EKG  EKG Interpretation  Date/Time:  Monday May 02 2016 17:59:01 EDT Ventricular Rate:  136 PR Interval:    QRS Duration: 102 QT Interval:  295 QTC Calculation: 444 R Axis:   66 Text Interpretation:  Sinus tachycardia Confirmed by Ranae Palms  MD, DAVID ([id number]) on 05/02/2016 8:08:34 PM       Radiology Ct Renal Stone Study  Result Date: 05/01/2016 CLINICAL DATA:  Left flank pain beginning last night. Fever and chills. EXAM: CT ABDOMEN AND PELVIS WITHOUT CONTRAST TECHNIQUE: Multidetector CT imaging of the abdomen and pelvis was performed following the standard protocol without IV contrast. COMPARISON:  03/26/2014 FINDINGS: Lower chest: The visualized lung bases are clear. Hepatobiliary: Diffusely decreased attenuation of the liver consistent with steatosis. Prior cholecystectomy. No biliary dilatation. Pancreas: Unremarkable. Spleen: Unremarkable. Adrenals/Urinary Tract: Unremarkable adrenal glands. Punctate nonobstructing calculus in the lower pole of the left kidney (series 5, image 89). Two suspected punctate nonobstructing calculi in the interpolar region/lower pole of the right kidney. No right-sided hydronephrosis. No left-sided caliectasis, however there is mild left pelviectasis and moderate proximal hydroureter. The left ureter demonstrates an abrupt transition point in its proximal to midportion and is decompressed distally (series 2, image 41 and series 6, image 116). The proximal ureter is mildly prominent on multiple prior studies with an abrupt tapering at the same level, although proximal dilatation is greater on today's examination. No ureteral calculi are identified. There is an accessory renal vein draining the lower pole of the left kidney which crosses posterior to the ureter at the transition point, with the gonadal vein being immediately anterior to the ureter at this  level. No perinephric stranding. The bladder is unremarkable. Stomach/Bowel: The stomach is within normal limits. There is no evidence of bowel obstruction or inflammation. The appendix is unremarkable. Vascular/Lymphatic: Normal caliber of the abdominal aorta. No enlarged lymph nodes. Reproductive: Unremarkable uterus and left ovary. 2.4 cm right adnexal cyst has a benign appearance. Prior Essure sterilization. Other: No intraperitoneal free fluid. No abdominal wall mass or hernia. Musculoskeletal: No acute osseous abnormality or suspicious osseous lesion. IMPRESSION: 1. Mild left pelviectasis and proximal hydroureter without obstructing stone identified. This could reflect recent stone passage or infection. The proximal to mid ureter abruptly transitions in caliber with a similar though less prominent appearance on multiple prior examinations. This could reflect an underlying stricture or compression between the gonadal vein and an accessory renal vein. Contrast-enhanced hematuria protocol CT is recommended for further evaluation but may be performed as an outpatient unless more immediate evaluation is clinically necessary. 2. Hepatic steatosis. Electronically Signed   By: Sebastian Ache M.D.   On: 05/01/2016 13:30    Procedures Procedures (including critical care time)  CRITICAL CARE- sepsis due to pyelonephritis Performed by: Rhona Raider   Total critical care time: 45 minutes  Critical care time was exclusive of separately billable procedures and treating other patients.  Critical care was necessary to treat or prevent imminent or life-threatening deterioration.  Critical care was time spent personally by me on the following activities: development of treatment plan with patient and/or surrogate as well as nursing, discussions with consultants, evaluation of patient's response to treatment, examination of patient, obtaining history from patient or surrogate, ordering and performing treatments  and interventions, ordering and review of laboratory studies, ordering and review of radiographic studies, pulse oximetry and re-evaluation of patient's condition.   Medications Ordered in ED Medications  cefTRIAXone (ROCEPHIN) 1 g in dextrose 5 % 50 mL IVPB (not administered)  HYDROmorphone (DILAUDID) injection 1 mg (not administered)  sodium chloride 0.9 % bolus 1,000 mL (0 mLs Intravenous Stopped 05/02/16 1848)    And  sodium chloride 0.9 % bolus 1,000 mL (0 mLs Intravenous Stopped 05/02/16 1945)    And  sodium chloride  0.9 % bolus 500 mL (500 mLs Intravenous New Bag/Given 05/02/16 1944)  cefTRIAXone (ROCEPHIN) 2 g in dextrose 5 % 50 mL IVPB (0 g Intravenous Stopped 05/02/16 1758)  ondansetron (ZOFRAN) injection 4 mg (4 mg Intravenous Given 05/02/16 1721)  morphine 4 MG/ML injection 4 mg (4 mg Intravenous Given 05/02/16 1722)  ibuprofen (ADVIL,MOTRIN) tablet 800 mg (800 mg Oral Given 05/02/16 1727)  HYDROmorphone (DILAUDID) injection 1 mg (1 mg Intravenous Given 05/02/16 1752)  potassium chloride SA (K-DUR,KLOR-CON) CR tablet 40 mEq (40 mEq Oral Given 05/02/16 1944)     Initial Impression / Assessment and Plan / ED Course  I have reviewed the triage vital signs and the nursing notes.  Pertinent labs & imaging results that were available during my care of the patient were reviewed by me and considered in my medical decision making (see chart for details).     31 y.o. female here with worsening L flank pain, fevers/chills, nausea/vomiting, dysuria, and urinary hesitancy/urgency. On exam, appears uncomfortable, febrile 103.1, moderate LUQ TTP and exquisite L CVA TTP; mildly tachycardic in 120-130s. Was seen yesterday and had labs that were unremarkable, wet prep showed WBCs but otherwise unremarkable, CT scan showed ?recently passed stone, as well as an area of ?ureteral stricture (recommended outpatient CT for nonemergent eval). U/A showed +infection, was supposed to be cultured but doesn't seem  to have made it to culture. Symptoms and exam consistent with pyelonephritis, which is perhaps complicated by her potential ureteral stricture. Although her vitals meet SIRS criteria, pt doesn't appear septic, will hold off on calling code sepsis but will get work up in line with these orders. Will hold off on repeat CT imaging at this time. Will get labs, give ibuprofen for fever, zofran and morphine for pain/nausea, weight based fluids since she's fairly tachycardic, and rocephin empirically. U/A and UCx ordered as well. Will reassess shortly; likely admission.   6:50 PM EKG with sinus tachycardia but otherwise unremarkable. CBC w/diff with leukocytosis WBC 11.1, neutrophilic predominance; this is up from WBC 10.4 yesterday. CMP with mildly low K 3.2 which is similar to yesterday's values, will orally replete. U/A not yet done, will have this collected ASAP. BetaHCG neg. Lactic WNL. Pt still having pain after morphine, given dilaudid and now feeling slightly better. Repeat VS show temp 99.4 now, still tachycardic but fluids running so should start to help; lips still look a little dry. Will try to give [place] U/A, then will decide on admission. Will reassess shortly.   8:20 PM U/A with +nitrites, small leuks, 6-30 WBC and RBCs, still with many bacteria and only 0-5 squamous. Re-confirms the source, which is UTI/pyelonephritis. Pain returning, will repeat pain meds and proceed with admission.   8:34 PM Dr. Adela Glimpse of [facility] returning page and will admit, but would like me to also touch base with Urology to discuss case, see if we need to do anything further regarding the possible ureteral stricture area. Will consult them now. Of note, holding orders to be placed by admitting team. Will reassess shortly  8:41 PM Dr. Toni Arthurs of urology returning page, will look at her chart and call me back with any further instructions/recommendations. Will await further instructions, but they will see pt in consultation.     9:29 PM Haven't heard from Dr. Regis Bill but it seems that Dr. Celene Kras H&P indicates that she may have spoken with him already. Please see their notes for further documentation of care. I appreciate their help with this pleasant pt's  care. Pt stable at time of admission.    Final Clinical Impressions(s) / ED Diagnoses   Final diagnoses:  Pyelonephritis  Left flank pain  Fever, unspecified fever cause  Nausea and vomiting in adult patient  Neutrophilic leukocytosis  Hypokalemia  Sepsis, due to unspecified organism [facility])    New Prescriptions New Prescriptions   No medications on file     [address] PA-C 05/02/16 2130    Loren Racer, MD 05/03/16 2333

## 2016-05-02 NOTE — H&P (Signed)
Kristen Klein ZOX:[id number] DOB: 12-27-1985 DOA: 05/02/2016     PCP: Robley Fries, MD   Outpatient Specialists: Urology Patient coming from:    home Lives  With family    Chief Complaint: back pain fever  HPI: Kristen Klein is a 31 y.o. female with medical history significant of an anxiety depression hypertension PFO thyroid disease ureteral stricture    Presented with 5 days of left-sided flank pain radiates to the lower abdomen pain has worsened 2 days ago. It's sharp and shoots down her flank. Worse with any kind of movement. She have had nausea vomiting fever and chills started 2 days ago. At home with temperature up to 102 she attempted to use ibuprofen but it did not seem to help and was seen for this in emergency department yesterday and is found to have UTI pelvic exam was unremarkable CT renal scan with no stone but there is some ureteral dilatation with questionable stricture with plan was to have outpatient CT to confirm this. She was discharged home on antibiotics Keflex (never had a chance to pick it up it was not available at the pharmacy. Also was given prescription for oxycodone ( was able to fill). Patient continued to do much worse she called urology who asked her to go to emergency department. Patient returned today with severe left-sided flank pain and nausea chills headache and fever. Her urine has been having strong smell of fever up to 103.6 she continued to vomit her pain has persisted and gotten worse. Percocet did not seem to help. Last nigh she had once episode of diarrhea and it self resolved.   Patient used to be seeing Urology for problems voiding years ago. Last UTI was 2 months she gets them once or twice a year.     IN ER:  Temp (24hrs), Avg:100.5 F (38.1 C), Min:99 F (37.2 C), Max:103.1 F (39.5 C)     RR 21 98% Hr 135 Bp 128/70  WBC 11.1 hg 12.6 Na 135 K 3.2 Lactic acid 1.67 Meeting sepsis criteria with fever tachycardia elevated  white blood cell count and respirations Following Medications were ordered in ER: Medications  cefTRIAXone (ROCEPHIN) 1 g in dextrose 5 % 50 mL IVPB (not administered)  HYDROmorphone (DILAUDID) injection 1 mg (1 mg Intravenous Given 05/02/16 2032)  sodium chloride 0.9 % bolus 500 mL (not administered)  sodium chloride 0.9 % bolus 1,000 mL (0 mLs Intravenous Stopped 05/02/16 1848)    And  sodium chloride 0.9 % bolus 1,000 mL (0 mLs Intravenous Stopped 05/02/16 1945)    And  sodium chloride 0.9 % bolus 500 mL (0 mLs Intravenous Stopped 05/02/16 2032)  cefTRIAXone (ROCEPHIN) 2 g in dextrose 5 % 50 mL IVPB (0 g Intravenous Stopped 05/02/16 1758)  ondansetron (ZOFRAN) injection 4 mg (4 mg Intravenous Given 05/02/16 1721)  morphine 4 MG/ML injection 4 mg (4 mg Intravenous Given 05/02/16 1722)  ibuprofen (ADVIL,MOTRIN) tablet 800 mg (800 mg Oral Given 05/02/16 1727)  HYDROmorphone (DILAUDID) injection 1 mg (1 mg Intravenous Given 05/02/16 1752)  potassium chloride SA (K-DUR,KLOR-CON) CR tablet 40 mEq (40 mEq Oral Given 05/02/16 1944)     ER provider discussed case with: Urology who will see patient in consult  Hospitalist was called for admission for sepsis secondary to pyelonephritis  Review of Systems:    Pertinent positives include: Fevers, chills, fatigue,  dysuria, change in color of urine, urgency or frequency.  Constitutional:  No weight loss, night sweats, weight loss  HEENT:  No headaches, Difficulty swallowing,Tooth/dental problems,Sore throat,  No sneezing, itching, ear ache, nasal congestion, post nasal drip,  Cardio-vascular:  No chest pain, Orthopnea, PND, anasarca, dizziness, palpitations.no Bilateral lower extremity swelling  GI:  No heartburn, indigestion, abdominal pain, nausea, vomiting, diarrhea, change in bowel habits, loss of appetite, melena, blood in stool, hematemesis Resp:  no shortness of breath at rest. No dyspnea on exertion, No excess mucus, no productive cough, No  non-productive cough, No coughing up of blood.No change in color of mucus.No wheezing. Skin:  no rash or lesions. No jaundice GU:   no  No straining to urinate.  No flank pain.  Musculoskeletal:  No joint pain or no joint swelling. No decreased range of motion. No back pain.  Psych:  No change in mood or affect. No depression or anxiety. No memory loss.  Neuro: no localizing neurological complaints, no tingling, no weakness, no double vision, no gait abnormality, no slurred speech, no confusion  As per HPI otherwise 10 point review of systems negative.   Past Medical History: Past Medical History:  Diagnosis Date  . Anxiety   . Depression   . Difficult intubation 12/09/2011  . Headache(784.0)   . Hypertension   . Migraine   . PFO (patent foramen ovale)   . Sterilization 12/09/2011  . Thyroid disease    hyperthyroid   Past Surgical History:  Procedure Laterality Date  . CHOLECYSTECTOMY OPEN  2011  . CRYOTHERAPY    . DILATION AND EVACUATION  2009  . DILITATION & CURRETTAGE/HYSTROSCOPY WITH ESSURE  12/09/2011   Procedure: DILATATION & CURETTAGE/HYSTEROSCOPY WITH ESSURE;  Surgeon: Robley Fries, MD;  Location: [facility];  Service: Gynecology;  Laterality: N/A;  . TEE WITHOUT CARDIOVERSION N/A 08/13/2013   Procedure: TRANSESOPHAGEAL ECHOCARDIOGRAM (TEE);  Surgeon: Pamella Pert, MD;  Location: [facility] ENDOSCOPY;  Service: Cardiovascular;  Laterality: N/A;  . WISDOM TOOTH EXTRACTION  2010     Social History:  Ambulatory   Independently    reports that she quit smoking about 12 years ago. Her smoking use included Cigarettes. She has a 0.30 pack-year smoking history. She has never used smokeless tobacco. She reports that she drinks alcohol. She reports that she does not use drugs.  Allergies:  No Known Allergies     Family History:   Family History  Problem Relation Age of Onset  . Hypertension Mother   . Hypothyroidism Mother   . Hypertension Father   . Alcohol abuse  Sister   . Asthma Son   . Allergies Son   . Diabetes Paternal Grandmother     Medications: Prior to Admission medications   Medication Sig Start Date End Date Taking? Authorizing Provider  Biotin 1000 MCG tablet Take 5,000 mcg by mouth daily.    Yes Historical Provider, MD  cholecalciferol (VITAMIN D) 1000 units tablet Take 2,000 Units by mouth daily.   Yes Historical Provider, MD  FLUoxetine (PROZAC) 20 MG capsule Take 20 mg by mouth at bedtime.  11/17/15  Yes Historical Provider, MD  hydrochlorothiazide (HYDRODIURIL) 25 MG tablet Take 25 mg by mouth at bedtime.  11/03/15  Yes Historical Provider, MD  ibuprofen (ADVIL,MOTRIN) 200 MG tablet Take 600-800 mg by mouth every 6 (six) hours as needed for moderate pain.    Yes Historical Provider, MD  irbesartan (AVAPRO) 75 MG tablet Take 75 mg by mouth daily. 03/22/16  Yes Historical Provider, MD  oxyCODONE-acetaminophen (PERCOCET/ROXICET) 5-325 MG tablet Take 2 tablets by mouth every 4 (four) hours as  needed for severe pain. 05/01/16  Yes Maxwell Caul, PA-C  zolpidem (AMBIEN) 5 MG tablet Take 5 mg by mouth at bedtime as needed for sleep. 10/07/15  Yes Historical Provider, MD  ALPRAZolam Prudy Feeler) 0.5 MG tablet Take 0.5 mg by mouth 2 (two) times daily as needed for anxiety.     Historical Provider, MD  cephALEXin (KEFLEX) 500 MG capsule Take 1 capsule (500 mg total) by mouth 4 (four) times daily. 05/01/16   Maxwell Caul, PA-C    Physical Exam: Patient Vitals for the past 24 hrs:  BP Temp Temp src Pulse Resp SpO2 Height Weight  05/02/16 2100 112/62 - - (!) 123 18 98 % - -  05/02/16 2000 119/72 - - (!) 126 17 97 % - -  05/02/16 1920 128/70 99 F (37.2 C) Oral (!) 135 (!) 21 98 % - -  05/02/16 1900 (!) 152/82 - - (!) 138 (!) 28 98 % - -  05/02/16 1848 128/75 99.5 F (37.5 C) Oral (!) 133 (!) 28 98 % - -  05/02/16 1830 128/75 - - (!) 130 (!) 31 98 % - -  05/02/16 1800 130/70 - - (!) 137 18 99 % - -  05/02/16 1755 125/74 - - (!) 137 18 100 % -  -  05/02/16 1633 138/89 - - (!) 140 - 96 % - -  05/02/16 1623 - - - - - - 5' (1.524 m) 79.4 kg (175 lb)  05/02/16 1618 (!) 163/106 (!) 103.1 F (39.5 C) Oral (!) 157 20 100 % - -    1. General:  in No Acute distress 2. Psychological: Alert and   Oriented 3. Head/ENT:     Dry Mucous Membranes                          Head Non traumatic, neck supple                          Normal    Dentition 4. SKIN:  decreased Skin turgor,  Skin clean Dry and intact no rash 5. Heart: rapid Regular rate and rhythm no Murmur, Rub or gallop 6. Lungs:  Clear to auscultation bilaterally, no wheezes or crackles   7. Abdomen: Left flank tenderness Abd: Soft,  non-tender, Non distended 8. Lower extremities: no clubbing, cyanosis, or edema 9. Neurologically Grossly intact, moving all 4 extremities equally  10. MSK: Normal range of motion   body mass index is 34.18 kg/m.  Labs on Admission:   Labs on Admission: I have personally reviewed following labs and imaging studies  CBC:  Recent Labs Lab 05/01/16 1120 05/02/16 1739  WBC 10.4 11.1*  NEUTROABS 6.9 9.1*  HGB 13.4 12.6  HCT 37.3 36.1  MCV 84.8 83.6  PLT 234 196   Basic Metabolic Panel:  Recent Labs Lab 05/01/16 1120 05/02/16 1739  NA 135 135  K 3.3* 3.2*  CL 101 101  CO2 25 25  GLUCOSE 103* 107*  BUN 8 9  CREATININE 0.72 0.84  CALCIUM 8.9 9.0   GFR: Estimated Creatinine Clearance: 91.4 mL/min (by C-G formula based on SCr of 0.84 mg/dL). Liver Function Tests:  Recent Labs Lab 05/02/16 1739  AST 35  ALT 54  ALKPHOS 67  BILITOT 0.9  PROT 8.1  ALBUMIN 4.4    Recent Labs Lab 05/01/16 1120  LIPASE 19   No results for input(s): AMMONIA in the last  168 hours. Coagulation Profile: No results for input(s): INR, PROTIME in the last 168 hours. Cardiac Enzymes: No results for input(s): CKTOTAL, CKMB, CKMBINDEX, TROPONINI in the last 168 hours. BNP (last 3 results) No results for input(s): PROBNP in the last 8760  hours. HbA1C: No results for input(s): HGBA1C in the last 72 hours. CBG: No results for input(s): GLUCAP in the last 168 hours. Lipid Profile: No results for input(s): CHOL, HDL, LDLCALC, TRIG, CHOLHDL, LDLDIRECT in the last 72 hours. Thyroid Function Tests: No results for input(s): TSH, T4TOTAL, FREET4, T3FREE, THYROIDAB in the last 72 hours. Anemia Panel: No results for input(s): VITAMINB12, FOLATE, FERRITIN, TIBC, IRON, RETICCTPCT in the last 72 hours. Urine analysis:    Component Value Date/Time   COLORURINE YELLOW 05/02/2016 1625   APPEARANCEUR CLEAR 05/02/2016 1625   LABSPEC 1.011 05/02/2016 1625   PHURINE 7.0 05/02/2016 1625   GLUCOSEU NEGATIVE 05/02/2016 1625   HGBUR MODERATE (A) 05/02/2016 1625   BILIRUBINUR NEGATIVE 05/02/2016 1625   BILIRUBINUR neg 07/04/2012 1838   KETONESUR NEGATIVE 05/02/2016 1625   PROTEINUR NEGATIVE 05/02/2016 1625   UROBILINOGEN 0.2 03/26/2014 1055   NITRITE POSITIVE (A) 05/02/2016 1625   LEUKOCYTESUR SMALL (A) 05/02/2016 1625   Sepsis Labs: (procalcitonin:4,lacticidven:4) ) Recent Results (from the past 240 hour(s))  Wet prep, genital     Status: Abnormal   Collection Time: 05/01/16 12:09 PM  Result Value Ref Range Status   Yeast Wet Prep HPF POC NONE SEEN NONE SEEN Final   Trich, Wet Prep NONE SEEN NONE SEEN Final   Clue Cells Wet Prep HPF POC NONE SEEN NONE SEEN Final   WBC, Wet Prep HPF POC MANY (A) NONE SEEN Final   Sperm NONE SEEN  Final      UA   evidence of UTI    Lab Results  Component Value Date   HGBA1C 5.6 01/14/2014    Estimated Creatinine Clearance: 91.4 mL/min (by C-G formula based on SCr of 0.84 mg/dL).  BNP (last 3 results) No results for input(s): PROBNP in the last 8760 hours.   ECG REPORT  Independently reviewed Rate: 136  Rhythm: Sinus tachycardia ST&T Change: No acute ischemic changes  QTC 444  Filed Weights   05/02/16 1623  Weight: 79.4 kg (175 lb)     Cultures: No results found for:  SDES, SPECREQUEST, CULT, REPTSTATUS   Radiological Exams on Admission: Ct Renal Stone Study  Result Date: 05/01/2016 CLINICAL DATA:  Left flank pain beginning last night. Fever and chills. EXAM: CT ABDOMEN AND PELVIS WITHOUT CONTRAST TECHNIQUE: Multidetector CT imaging of the abdomen and pelvis was performed following the standard protocol without IV contrast. COMPARISON:  03/26/2014 FINDINGS: Lower chest: The visualized lung bases are clear. Hepatobiliary: Diffusely decreased attenuation of the liver consistent with steatosis. Prior cholecystectomy. No biliary dilatation. Pancreas: Unremarkable. Spleen: Unremarkable. Adrenals/Urinary Tract: Unremarkable adrenal glands. Punctate nonobstructing calculus in the lower pole of the left kidney (series 5, image 89). Two suspected punctate nonobstructing calculi in the interpolar region/lower pole of the right kidney. No right-sided hydronephrosis. No left-sided caliectasis, however there is mild left pelviectasis and moderate proximal hydroureter. The left ureter demonstrates an abrupt transition point in its proximal to midportion and is decompressed distally (series 2, image 41 and series 6, image 116). The proximal ureter is mildly prominent on multiple prior studies with an abrupt tapering at the same level, although proximal dilatation is greater on today's examination. No ureteral calculi are identified. There is an accessory renal vein draining the  lower pole of the left kidney which crosses posterior to the ureter at the transition point, with the gonadal vein being immediately anterior to the ureter at this level. No perinephric stranding. The bladder is unremarkable. Stomach/Bowel: The stomach is within normal limits. There is no evidence of bowel obstruction or inflammation. The appendix is unremarkable. Vascular/Lymphatic: Normal caliber of the abdominal aorta. No enlarged lymph nodes. Reproductive: Unremarkable uterus and left ovary. 2.4 cm right adnexal  cyst has a benign appearance. Prior Essure sterilization. Other: No intraperitoneal free fluid. No abdominal wall mass or hernia. Musculoskeletal: No acute osseous abnormality or suspicious osseous lesion. IMPRESSION: 1. Mild left pelviectasis and proximal hydroureter without obstructing stone identified. This could reflect recent stone passage or infection. The proximal to mid ureter abruptly transitions in caliber with a similar though less prominent appearance on multiple prior examinations. This could reflect an underlying stricture or compression between the gonadal vein and an accessory renal vein. Contrast-enhanced hematuria protocol CT is recommended for further evaluation but may be performed as an outpatient unless more immediate evaluation is clinically necessary. 2. Hepatic steatosis. Electronically Signed   By: Sebastian Ache M.D.   On: 05/01/2016 13:30    Chart has been reviewed    Assessment/Plan   31 y.o. female with medical history significant of an anxiety depression hypertension PFO thyroid disease ureteral stricture admitted for sepsis due to peylonephritis  Present on Admission: . Acute lower UTI treated with Rocephin, obtain urine culture and adjust medications as needed . Hypokalemia replace check magnesium level . Acute pyelonephritis - treat with Rocephin supportive measures rehydrate, discussed of urology will see in consult and I'll keep nothing by mouth until have decided whether patient would benefit from stenting given mild hydronephrosis. . Sepsis (HCC) Admit per Sepsis protocol likely source being  UTI,    - rehydrate with 67ml/kg  - initiate ceftriaxone IV  -  obtain blood cultures  - Obtain serial lactic acid  - Obtain procalcitonin level  - Admit and monitor vital signs closely    Sepsis - Repeat Assessment  Performed at:    9 pm  Vitals     Blood pressure 112/62, pulse (!) 123, temperature 99 F (37.2 C), temperature source Oral, resp. rate 18, height 5'  (1.524 m), weight 79.4 kg (175 lb), SpO2 98 %.  Heart:     Tachycardic  Lungs:    CTA  Capillary Refill:   <2 sec  Peripheral Pulse:   Radial pulse palpable  Skin:     Normal Color     Other plan as per orders.  AK in the setting of pyelonephritis sepsis and mild unilateral hydronephrosis with possible mild stricture hold ARB   urology  urology feels no indication for stenting at this time if no improvement in patient's status in the next few days may benefit from renal ultrasound to evaluate for abscess  follow Cr OBTAIN URINE STUDIES DVT prophylaxis:  SCD    Code Status:  FULL CODE   as per patient    Family Communication:   Family  at  Bedside  plan of care was discussed with  Sister,   mother  Disposition Plan:    To home once workup is complete and patient is stable     Consults called: Urology  Admission status:    inpatient      Level of care     SDU given severity of tachycardia if patient improves can change to telemetry  I have spent a total of 66 min on this admission   extra time was spent to discuss case with urology  Kacyn Souder 05/02/2016, 9:33 PM    [facility]  Pager [phone]   after 2 AM please page floor coverage PA If 7AM-7PM, please contact the day team taking care of the patient  [email or web address]  Password [email or web address]

## 2016-05-02 NOTE — ED Notes (Signed)
First set of blood cx obtained 

## 2016-05-02 NOTE — ED Notes (Signed)
Second set of blood cx obtained 

## 2016-05-02 NOTE — ED Notes (Signed)
Pt reports she started to have L flank pain since Thursday last week with nausea.  Was seen here yesterday and was dx with kidney stone which she had passed.  Pt started to have fever and worsening pain yesterday as well.  Was sent back to the ED by the nurse at [facility].

## 2016-05-02 NOTE — Progress Notes (Signed)
Pharmacy Antibiotic Note  Kristen Klein is a 31 y.o. female admitted on 05/02/2016 with UTI.  Pharmacy has been consulted for Ceftriaxone dosing.  Plan: -Ceftriaxone 2g IV x 1 given in the ED. Continue with Ceftriaxone 1g IV q24h. -Ceftriaxone does not require renal/hepatic adjustment, so pharmacy will sign off at this time. Please re-consult if re-evaluation warranted.  Height: 5' (152.4 cm) Weight: 175 lb (79.4 kg) IBW/kg (Calculated) : 45.5  Temp (24hrs), Avg:103.1 F (39.5 C), Min:103.1 F (39.5 C), Max:103.1 F (39.5 C)   Recent Labs Lab 05/01/16 1120 05/02/16 1725 05/02/16 1739  WBC 10.4  --  11.1*  CREATININE 0.72  --   --   LATICACIDVEN  --  1.67  --     Estimated Creatinine Clearance: 95.9 mL/min (by C-G formula based on SCr of 0.72 mg/dL).    No Known Allergies  Antimicrobials this admission: 4/23 >> Ceftriaxone >>  Dose adjustments this admission: --  Microbiology results: 4/23 BCx: sent 4/23 UCx: sent  Thank you for allowing pharmacy to be a part of this patient's care.    Greer Pickerel, PharmD, BCPS Pager: [phone] 05/02/2016 6:05 PM

## 2016-05-03 ENCOUNTER — Inpatient Hospital Stay (HOSPITAL_COMMUNITY): Payer: BLUE CROSS/BLUE SHIELD

## 2016-05-03 DIAGNOSIS — I10 Essential (primary) hypertension: Secondary | ICD-10-CM | POA: Diagnosis present

## 2016-05-03 DIAGNOSIS — E669 Obesity, unspecified: Secondary | ICD-10-CM

## 2016-05-03 LAB — COMPREHENSIVE METABOLIC PANEL
ALT: 79 U/L — ABNORMAL HIGH (ref 14–54)
ANION GAP: 6 (ref 5–15)
AST: 52 U/L — ABNORMAL HIGH (ref 15–41)
Albumin: 3.6 g/dL (ref 3.5–5.0)
Alkaline Phosphatase: 72 U/L (ref 38–126)
BUN: 6 mg/dL (ref 6–20)
CO2: 27 mmol/L (ref 22–32)
Calcium: 8.1 mg/dL — ABNORMAL LOW (ref 8.9–10.3)
Chloride: 104 mmol/L (ref 101–111)
Creatinine, Ser: 0.73 mg/dL (ref 0.44–1.00)
GFR calc non Af Amer: >60 mL/min (ref >60)
Glucose, Bld: 106 mg/dL — ABNORMAL HIGH (ref 65–99)
POTASSIUM: 4 mmol/L (ref 3.5–5.1)
Sodium: 137 mmol/L (ref 135–145)
TOTAL PROTEIN: 7.5 g/dL (ref 6.5–8.1)
Total Bilirubin: 1 mg/dL (ref 0.3–1.2)

## 2016-05-03 LAB — LACTIC ACID, PLASMA
LACTIC ACID, VENOUS: 0.8 mmol/L (ref 0.5–1.9)
Lactic Acid, Venous: 0.6 mmol/L (ref 0.5–1.9)

## 2016-05-03 LAB — BLOOD CULTURE ID PANEL (REFLEXED)
Acinetobacter baumannii: NOT DETECTED
CANDIDA GLABRATA: NOT DETECTED
CARBAPENEM RESISTANCE: NOT DETECTED
Candida albicans: NOT DETECTED
Candida krusei: NOT DETECTED
Candida parapsilosis: NOT DETECTED
Candida tropicalis: NOT DETECTED
ENTEROCOCCUS SPECIES: NOT DETECTED
ESCHERICHIA COLI: DETECTED — AB
Enterobacter cloacae complex: NOT DETECTED
Enterobacteriaceae species: DETECTED — AB
Haemophilus influenzae: NOT DETECTED
Klebsiella oxytoca: NOT DETECTED
Klebsiella pneumoniae: NOT DETECTED
LISTERIA MONOCYTOGENES: NOT DETECTED
NEISSERIA MENINGITIDIS: NOT DETECTED
Proteus species: NOT DETECTED
Pseudomonas aeruginosa: NOT DETECTED
SERRATIA MARCESCENS: NOT DETECTED
STAPHYLOCOCCUS AUREUS BCID: NOT DETECTED
STAPHYLOCOCCUS SPECIES: NOT DETECTED
STREPTOCOCCUS PNEUMONIAE: NOT DETECTED
Streptococcus agalactiae: NOT DETECTED
Streptococcus pyogenes: NOT DETECTED
Streptococcus species: NOT DETECTED

## 2016-05-03 LAB — CBC
HCT: 37.6 % (ref 36.0–46.0)
Hemoglobin: 13.1 g/dL (ref 12.0–15.0)
MCH: 30.2 pg (ref 26.0–34.0)
MCHC: 34.8 g/dL (ref 30.0–36.0)
MCV: 86.6 fL (ref 78.0–100.0)
Platelets: 205 10*3/uL (ref 150–400)
RBC: 4.34 MIL/uL (ref 3.87–5.11)
RDW: 12.6 % (ref 11.5–15.5)
WBC: 13 10*3/uL — AB (ref 4.0–10.5)

## 2016-05-03 LAB — PROCALCITONIN: Procalcitonin: 2.7 ng/mL

## 2016-05-03 LAB — PROTIME-INR
INR: 1.09
Prothrombin Time: 14.1 seconds (ref 11.4–15.2)

## 2016-05-03 LAB — SODIUM, URINE, RANDOM: SODIUM UR: 149 mmol/L

## 2016-05-03 LAB — MAGNESIUM: Magnesium: 1.9 mg/dL (ref 1.7–2.4)

## 2016-05-03 LAB — CREATININE, URINE, RANDOM: CREATININE, URINE: 59.86 mg/dL

## 2016-05-03 LAB — TSH: TSH: 4.147 u[IU]/mL (ref 0.350–4.500)

## 2016-05-03 LAB — PHOSPHORUS: PHOSPHORUS: 2.3 mg/dL — AB (ref 2.5–4.6)

## 2016-05-03 LAB — HIV ANTIBODY (ROUTINE TESTING W REFLEX): HIV Screen 4th Generation wRfx: NONREACTIVE

## 2016-05-03 LAB — APTT: aPTT: 35 seconds (ref 24–36)

## 2016-05-03 MED ORDER — ALPRAZOLAM 0.5 MG PO TABS: 0.5000 mg | ORAL_TABLET | Freq: Two times a day (BID) | ORAL | Status: DC | PRN

## 2016-05-03 MED ORDER — ONDANSETRON HCL 4 MG/2ML IJ SOLN
4.0000 mg | Freq: Four times a day (QID) | INTRAMUSCULAR | Status: DC | PRN
  Administered 2016-05-03 – 2016-05-04 (×3): 4 mg via INTRAVENOUS
  Filled 2016-05-03 (×3): qty 2

## 2016-05-03 MED ORDER — HYDROMORPHONE HCL 1 MG/ML IJ SOLN
1.0000 mg | INTRAMUSCULAR | Status: DC | PRN
  Administered 2016-05-03 – 2016-05-04 (×3): 1 mg via INTRAVENOUS
  Filled 2016-05-03 (×3): qty 1

## 2016-05-03 MED ORDER — ACETAMINOPHEN 650 MG RE SUPP: 650.0000 mg | Freq: Four times a day (QID) | RECTAL | Status: DC | PRN

## 2016-05-03 MED ORDER — ONDANSETRON HCL 4 MG PO TABS: 4.0000 mg | ORAL_TABLET | Freq: Four times a day (QID) | ORAL | Status: DC | PRN

## 2016-05-03 MED ORDER — FLUOXETINE HCL 20 MG PO CAPS
20.0000 mg | ORAL_CAPSULE | Freq: Every day | ORAL | Status: DC
  Administered 2016-05-03 – 2016-05-04 (×3): 20 mg via ORAL
  Filled 2016-05-03 (×3): qty 1

## 2016-05-03 MED ORDER — SODIUM CHLORIDE 0.9% FLUSH
3.0000 mL | Freq: Two times a day (BID) | INTRAVENOUS | Status: DC
  Administered 2016-05-03 – 2016-05-05 (×6): 3 mL via INTRAVENOUS

## 2016-05-03 MED ORDER — OXYCODONE-ACETAMINOPHEN 5-325 MG PO TABS
2.0000 | ORAL_TABLET | ORAL | Status: DC | PRN
  Administered 2016-05-04 (×3): 2 via ORAL
  Filled 2016-05-03 (×3): qty 2

## 2016-05-03 MED ORDER — DEXTROSE 5 % IV SOLN
2.0000 g | INTRAVENOUS | Status: DC
  Administered 2016-05-03 – 2016-05-04 (×2): 2 g via INTRAVENOUS
  Filled 2016-05-03 (×2): qty 2

## 2016-05-03 MED ORDER — HYDROCODONE-ACETAMINOPHEN 5-325 MG PO TABS
1.0000 | ORAL_TABLET | ORAL | Status: DC | PRN
  Administered 2016-05-03: 2 via ORAL
  Administered 2016-05-03: 1 via ORAL
  Administered 2016-05-04: 2 via ORAL
  Filled 2016-05-03: qty 2
  Filled 2016-05-03: qty 1
  Filled 2016-05-03: qty 2

## 2016-05-03 MED ORDER — ACETAMINOPHEN 325 MG PO TABS
650.0000 mg | ORAL_TABLET | Freq: Four times a day (QID) | ORAL | Status: DC | PRN
  Administered 2016-05-03: 650 mg via ORAL
  Filled 2016-05-03: qty 2

## 2016-05-03 MED ORDER — SODIUM CHLORIDE 0.9 % IV SOLN
INTRAVENOUS | Status: AC
  Administered 2016-05-03: 02:00:00 via INTRAVENOUS

## 2016-05-03 NOTE — Progress Notes (Signed)
PROGRESS NOTE  Kristen Klein DSK:[id number] DOB: 1985-01-19 DOA: 05/02/2016 PCP: Elveria Royals, MD  HPI/Recap of past 24 hours: Very pleasant 31 year old female past history of obesity and hypertension admitted to the hospitalist service on 4/23 after several days of fevers, chills and dysuria. Found to be in sepsis secondary to pyelonephritis. Started on IV fluids and antibiotics. Morning after admission, blood cultures grew out Escherichia coli.  Patient seen after arrival to floor. Complains of chills. Feeling a little bit better than previous day, but not much. Not much appetite. Occasional episodes of pain in the left flank.  Assessment/Plan: Principal Problem:   Sepsis due to Escherichia coli (E. coli) (HCC) from UTI/acute pyelonephritis: IV Rocephin. Patient on a total of 10 days of therapy. Expect a few days of fever despite antibiotics given pyelonephritis. Patient met criteria for sepsis on admission given leukocytosis, tachycardia, fever and urinary source. Blood pressure is much improved and have decreased IV fluids. Sepsis itself felt to be resolving. Continue antibiotics. Active Problems: Essential hypertension: Antihypertensives on hold until patient more stable.   Hypokalemia: Replacing  Obesity: Patient meets criteria for obesity with a BMI greater than 30  Code Status: Full code   Family Communication: Left message for family   Disposition Plan: Here for several days until white count improves, no further fevers and sensitivities returned on Escherichia coli    Consultants:  None   Procedures:  None   Antimicrobials:  IV Rocephin 2 g daily 4/23-present   DVT prophylaxis: Lovenox   Objective: Vitals:   05/03/16 1000 05/03/16 1100 05/03/16 1222 05/03/16 1240  BP: 122/79 (!) 97/58 128/73 (!) 130/97  Pulse: 97 89 95 (!) 111  Resp: (!) 21 (!) '21 18 18  ' Temp:    98.8 F (37.1 C)  TempSrc:    Oral  SpO2: 97% 97% 100% 100%  Weight:    82.2 kg  (181 lb 3.5 oz)  Height:    5' (1.524 m)    Intake/Output Summary (Last 24 hours) at 05/03/16 1422 Last data filed at 05/02/16 1848  Gross per 24 hour  Intake             1050 ml  Output                0 ml  Net             1050 ml   Filed Weights   05/02/16 1623 05/03/16 1240  Weight: 79.4 kg (175 lb) 82.2 kg (181 lb 3.5 oz)    Exam:   General:  Alert and oriented 3, no acute distress   Cardiovascular: Regular rate and rhythm, S1-S2   Respiratory: Clear to auscultation bilaterally   Abdomen: Soft, nontender, nondistended, positive bowel sounds, mild left flank tenderness   Musculoskeletal: No clubbing or cyanosis, trace pitting edema   Skin: No skin breaks, tears or lesions  Psychiatry: Patient is appropriate, no evidence of psychoses    Data Reviewed: CBC:  Recent Labs Lab 05/01/16 1120 05/02/16 1739 05/03/16 0435  WBC 10.4 11.1* 13.0*  NEUTROABS 6.9 9.1*  --   HGB 13.4 12.6 13.1  HCT 37.3 36.1 37.6  MCV 84.8 83.6 86.6  PLT 234 196 620   Basic Metabolic Panel:  Recent Labs Lab 05/01/16 1120 05/02/16 1739 05/03/16 0455  NA 135 135 137  K 3.3* 3.2* 4.0  CL 101 101 104  CO2 '25 25 27  ' GLUCOSE 103* 107* 106*  BUN '8 9 6  ' CREATININE 0.72  0.84 0.73  CALCIUM 8.9 9.0 8.1*  MG  --   --  1.9  PHOS  --   --  2.3*   GFR: Estimated Creatinine Clearance: 97.7 mL/min (by C-G formula based on SCr of 0.73 mg/dL). Liver Function Tests:  Recent Labs Lab 05/02/16 1739 05/03/16 0455  AST 35 52*  ALT 54 79*  ALKPHOS 67 72  BILITOT 0.9 1.0  PROT 8.1 7.5  ALBUMIN 4.4 3.6    Recent Labs Lab 05/01/16 1120  LIPASE 19   No results for input(s): AMMONIA in the last 168 hours. Coagulation Profile:  Recent Labs Lab 05/03/16 0135  INR 1.09   Cardiac Enzymes: No results for input(s): CKTOTAL, CKMB, CKMBINDEX, TROPONINI in the last 168 hours. BNP (last 3 results) No results for input(s): PROBNP in the last 8760 hours. HbA1C: No results for input(s):  HGBA1C in the last 72 hours. CBG: No results for input(s): GLUCAP in the last 168 hours. Lipid Profile: No results for input(s): CHOL, HDL, LDLCALC, TRIG, CHOLHDL, LDLDIRECT in the last 72 hours. Thyroid Function Tests:  Recent Labs  05/03/16 0435  TSH 4.147   Anemia Panel: No results for input(s): VITAMINB12, FOLATE, FERRITIN, TIBC, IRON, RETICCTPCT in the last 72 hours. Urine analysis:    Component Value Date/Time   COLORURINE YELLOW 05/02/2016 1625   APPEARANCEUR CLEAR 05/02/2016 1625   LABSPEC 1.011 05/02/2016 1625   PHURINE 7.0 05/02/2016 1625   GLUCOSEU NEGATIVE 05/02/2016 1625   HGBUR MODERATE (A) 05/02/2016 1625   BILIRUBINUR NEGATIVE 05/02/2016 1625   BILIRUBINUR neg 07/04/2012 1838   KETONESUR NEGATIVE 05/02/2016 1625   PROTEINUR NEGATIVE 05/02/2016 1625   UROBILINOGEN 0.2 03/26/2014 1055   NITRITE POSITIVE (A) 05/02/2016 1625   LEUKOCYTESUR SMALL (A) 05/02/2016 1625   Sepsis Labs: '@LABRCNTIP' (procalcitonin:4,lacticidven:4)  ) Recent Results (from the past 240 hour(s))  Wet prep, genital     Status: Abnormal   Collection Time: 05/01/16 12:09 PM  Result Value Ref Range Status   Yeast Wet Prep HPF POC NONE SEEN NONE SEEN Final   Trich, Wet Prep NONE SEEN NONE SEEN Final   Clue Cells Wet Prep HPF POC NONE SEEN NONE SEEN Final   WBC, Wet Prep HPF POC MANY (A) NONE SEEN Final   Sperm NONE SEEN  Final  Blood Culture (routine x 2)     Status: None (Preliminary result)   Collection Time: 05/02/16  5:39 PM  Result Value Ref Range Status   Specimen Description BLOOD RIGHT ANTECUBITAL  Final   Special Requests   Final    BOTTLES DRAWN AEROBIC AND ANAEROBIC Blood Culture adequate volume   Culture  Setup Time   Final    GRAM NEGATIVE RODS IN BOTH AEROBIC AND ANAEROBIC BOTTLES Organism ID to follow CRITICAL RESULT CALLED TO, READ BACK BY AND VERIFIED WITH: Christean Grief Pharm.D. 9:35 05/03/16 ([email or web address]) Performed at [facility], [place] [address] [place], [place]  [address]    Culture GRAM NEGATIVE RODS  Final   Report Status PENDING  Incomplete  Blood Culture ID Panel (Reflexed)     Status: Abnormal   Collection Time: 05/02/16  5:39 PM  Result Value Ref Range Status   Enterococcus species NOT DETECTED NOT DETECTED Final   Listeria monocytogenes NOT DETECTED NOT DETECTED Final   Staphylococcus species NOT DETECTED NOT DETECTED Final   Staphylococcus aureus NOT DETECTED NOT DETECTED Final   Streptococcus species NOT DETECTED NOT DETECTED Final   Streptococcus agalactiae NOT DETECTED NOT DETECTED Final  Streptococcus pneumoniae NOT DETECTED NOT DETECTED Final   Streptococcus pyogenes NOT DETECTED NOT DETECTED Final   Acinetobacter baumannii NOT DETECTED NOT DETECTED Final   Enterobacteriaceae species DETECTED (A) NOT DETECTED Final    Comment: Enterobacteriaceae represent a large family of gram-negative bacteria, not a single organism. CRITICAL RESULT CALLED TO, READ BACK BY AND VERIFIED WITH: Christean Grief Pharm.D. 9:35 05/03/16 ([email or web address])    Enterobacter cloacae complex NOT DETECTED NOT DETECTED Final   Escherichia coli DETECTED (A) NOT DETECTED Final    Comment: CRITICAL RESULT CALLED TO, READ BACK BY AND VERIFIED WITH: Mila Merry.D. 9:35 05/03/16 ([email or web address])    Klebsiella oxytoca NOT DETECTED NOT DETECTED Final   Klebsiella pneumoniae NOT DETECTED NOT DETECTED Final   Proteus species NOT DETECTED NOT DETECTED Final   Serratia marcescens NOT DETECTED NOT DETECTED Final   Carbapenem resistance NOT DETECTED NOT DETECTED Final   Haemophilus influenzae NOT DETECTED NOT DETECTED Final   Neisseria meningitidis NOT DETECTED NOT DETECTED Final   Pseudomonas aeruginosa NOT DETECTED NOT DETECTED Final   Candida albicans NOT DETECTED NOT DETECTED Final   Candida glabrata NOT DETECTED NOT DETECTED Final   Candida krusei NOT DETECTED NOT DETECTED Final   Candida parapsilosis NOT DETECTED NOT DETECTED Final   Candida tropicalis NOT DETECTED NOT DETECTED Final      Comment: Performed at [facility], [place] [address] [place], [place] [address]  Blood Culture (routine x 2)     Status: None (Preliminary result)   Collection Time: 05/02/16  5:50 PM  Result Value Ref Range Status   Specimen Description BLOOD LEFT ANTECUBITAL  Final   Special Requests   Final    BOTTLES DRAWN AEROBIC AND ANAEROBIC Blood Culture adequate volume   Culture   Final    NO GROWTH < 12 HOURS Performed at [facility], [place] [address] [place], [place] [address]    Report Status PENDING  Incomplete      Studies: Dg Chest Port 1 View  Result Date: 05/03/2016 CLINICAL DATA:  Acute onset of fever.  Sepsis.  Initial encounter. EXAM: PORTABLE CHEST 1 VIEW COMPARISON:  Chest radiograph performed 12/21/2015 FINDINGS: The lungs are well-aerated and clear. There is no evidence of focal opacification, pleural effusion or pneumothorax. The cardiomediastinal silhouette is within normal limits. No acute osseous abnormalities are seen. IMPRESSION: No acute cardiopulmonary process seen. Electronically Signed   By: Garald Balding M.D.   On: 05/03/2016 01:26    Scheduled Meds: . FLUoxetine  20 mg Oral QHS  . sodium chloride flush  3 mL Intravenous Q12H    Continuous Infusions: . cefTRIAXone (ROCEPHIN)  IV       LOS: 1 day     Annita Brod, MD [facility] Pager [phone]  If 7PM-7AM, please contact night-coverage [email or web address] Password [email or web address] 05/03/2016, 2:22 PM

## 2016-05-03 NOTE — Consult Note (Signed)
Urology Consult Note   Requesting Attending Physician:  Hollice Espy, MD Service Providing Consult: Urology Consulting Attending: Laverle Patter, MD  Assessment:  Patient is a 31 y.o. female with history of anxiety, depression, hypertension presents with left flank pain and clinical picture consistent with left pyelonephritis. CT imaging appears to show left proximal ureteral dilation and pelviectasis which is mild. There is also a crossing vessel noted to the lower pole of the left kidney likely stemming from the left common iliac/aortic bifurcation area. She has had multiple CT scans including some in our system from 2014 and 2016 with no significant left proximal ureteral dilation noted. She has adequate left renal parenchymal and normal renal function. While she may have a very minor or subclinical left UPJ obstruction, we do not favor that this is the cause of her mild dilation currently and instead favor ureteritis with dilation likely secondary from bacterial endotoxins in the setting of her upper urinary tract infection. As such, we would not recommend surgical intervention.  Interval: Since our evaluation in ER her tachycardia is trending the right way, she has remained afebrile with stable BP. No outs recorded. Lactic acid 0.6. WBC 13. Cr 0.7. Cultures pending. On rocephin. She is still in the ER.  Recommendations: -- Continue IV antibiotics and IV fluid resuscitation -- Follow-up blood and urine cultures, tailor antibiotics appropriately  Thank you for this consult. Please contact the urology consult pager with any further questions/concerns. Jalene Mullet, MD Urology Surgical Resident   Subjective Continues to have left flank pain, 4/10. Bad headache right now. Still in ER waiting to go to bed. Tells me she has been spontaneously voiding. + nausea, retching.   Objective   Vital signs in last 24 hours: BP (!) 136/100 (BP Location: Right Arm)   Pulse (!) 116   Temp 98.6 F (37  C) (Oral)   Resp 20   Ht 5' (1.524 m)   Wt 79.4 kg (175 lb)   SpO2 99%   BMI 34.18 kg/m   Intake/Output last 3 shifts: I/O last 3 completed shifts: In: 1050 [IV Piggyback:1050] Out: -   Physical Exam General: NAD, A&O, resting, appears uncomfortable HEENT: [place]/AT, EOMI, MMM Pulmonary: Normal work of breathing on RA Cardiovascular: tachycardic, HDS, adequate peripheral perfusion Abdomen: soft, TTP in left flank, nondistended, no suprapubic fullness or tenderness GU: no Foley present, + left CVA tenderness Extremities: warm and well perfused, no edema Neuro: Appropriate, no focal neurological deficits  Most Recent Labs: Lab Results  Component Value Date   WBC 13.0 (H) 05/03/2016   HGB 13.1 05/03/2016   HCT 37.6 05/03/2016   PLT 205 05/03/2016    Lab Results  Component Value Date   NA 137 05/03/2016   K 4.0 05/03/2016   CL 104 05/03/2016   CO2 27 05/03/2016   BUN 6 05/03/2016   CREATININE 0.73 05/03/2016   CALCIUM 8.1 (L) 05/03/2016   MG 1.9 05/03/2016   PHOS 2.3 (L) 05/03/2016    Lab Results  Component Value Date   ALKPHOS 72 05/03/2016   BILITOT 1.0 05/03/2016   PROT 7.5 05/03/2016   ALBUMIN 3.6 05/03/2016   ALT 79 (H) 05/03/2016   AST 52 (H) 05/03/2016    Lab Results  Component Value Date   INR 1.09 05/03/2016   APTT 35 05/03/2016     Urine Culture: Pending   IMAGING: Dg Chest Port 1 View  Result Date: 05/03/2016 CLINICAL DATA:  Acute onset of fever.  Sepsis.  Initial encounter.  EXAM: PORTABLE CHEST 1 VIEW COMPARISON:  Chest radiograph performed 12/21/2015 FINDINGS: The lungs are well-aerated and clear. There is no evidence of focal opacification, pleural effusion or pneumothorax. The cardiomediastinal silhouette is within normal limits. No acute osseous abnormalities are seen. IMPRESSION: No acute cardiopulmonary process seen. Electronically Signed   By: Roanna Raider M.D.   On: 05/03/2016 01:26   Ct Renal Stone Study  Result Date:  05/01/2016 CLINICAL DATA:  Left flank pain beginning last night. Fever and chills. EXAM: CT ABDOMEN AND PELVIS WITHOUT CONTRAST TECHNIQUE: Multidetector CT imaging of the abdomen and pelvis was performed following the standard protocol without IV contrast. COMPARISON:  03/26/2014 FINDINGS: Lower chest: The visualized lung bases are clear. Hepatobiliary: Diffusely decreased attenuation of the liver consistent with steatosis. Prior cholecystectomy. No biliary dilatation. Pancreas: Unremarkable. Spleen: Unremarkable. Adrenals/Urinary Tract: Unremarkable adrenal glands. Punctate nonobstructing calculus in the lower pole of the left kidney (series 5, image 89). Two suspected punctate nonobstructing calculi in the interpolar region/lower pole of the right kidney. No right-sided hydronephrosis. No left-sided caliectasis, however there is mild left pelviectasis and moderate proximal hydroureter. The left ureter demonstrates an abrupt transition point in its proximal to midportion and is decompressed distally (series 2, image 41 and series 6, image 116). The proximal ureter is mildly prominent on multiple prior studies with an abrupt tapering at the same level, although proximal dilatation is greater on today's examination. No ureteral calculi are identified. There is an accessory renal vein draining the lower pole of the left kidney which crosses posterior to the ureter at the transition point, with the gonadal vein being immediately anterior to the ureter at this level. No perinephric stranding. The bladder is unremarkable. Stomach/Bowel: The stomach is within normal limits. There is no evidence of bowel obstruction or inflammation. The appendix is unremarkable. Vascular/Lymphatic: Normal caliber of the abdominal aorta. No enlarged lymph nodes. Reproductive: Unremarkable uterus and left ovary. 2.4 cm right adnexal cyst has a benign appearance. Prior Essure sterilization. Other: No intraperitoneal free fluid. No abdominal  wall mass or hernia. Musculoskeletal: No acute osseous abnormality or suspicious osseous lesion. IMPRESSION: 1. Mild left pelviectasis and proximal hydroureter without obstructing stone identified. This could reflect recent stone passage or infection. The proximal to mid ureter abruptly transitions in caliber with a similar though less prominent appearance on multiple prior examinations. This could reflect an underlying stricture or compression between the gonadal vein and an accessory renal vein. Contrast-enhanced hematuria protocol CT is recommended for further evaluation but may be performed as an outpatient unless more immediate evaluation is clinically necessary. 2. Hepatic steatosis. Electronically Signed   By: Sebastian Ache M.D.   On: 05/01/2016 13:30

## 2016-05-03 NOTE — Progress Notes (Signed)
PHARMACY - PHYSICIAN COMMUNICATION CRITICAL VALUE ALERT - BLOOD CULTURE IDENTIFICATION (BCID)  Results for orders placed or performed during the hospital encounter of 05/02/16  Blood Culture ID Panel (Reflexed) (Collected: 05/02/2016  5:39 PM)  Result Value Ref Range   Enterococcus species NOT DETECTED NOT DETECTED   Listeria monocytogenes NOT DETECTED NOT DETECTED   Staphylococcus species NOT DETECTED NOT DETECTED   Staphylococcus aureus NOT DETECTED NOT DETECTED   Streptococcus species NOT DETECTED NOT DETECTED   Streptococcus agalactiae NOT DETECTED NOT DETECTED   Streptococcus pneumoniae NOT DETECTED NOT DETECTED   Streptococcus pyogenes NOT DETECTED NOT DETECTED   Acinetobacter baumannii NOT DETECTED NOT DETECTED   Enterobacteriaceae species DETECTED (A) NOT DETECTED   Enterobacter cloacae complex NOT DETECTED NOT DETECTED   Escherichia coli DETECTED (A) NOT DETECTED   Klebsiella oxytoca NOT DETECTED NOT DETECTED   Klebsiella pneumoniae NOT DETECTED NOT DETECTED   Proteus species NOT DETECTED NOT DETECTED   Serratia marcescens NOT DETECTED NOT DETECTED   Carbapenem resistance NOT DETECTED NOT DETECTED   Haemophilus influenzae NOT DETECTED NOT DETECTED   Neisseria meningitidis NOT DETECTED NOT DETECTED   Pseudomonas aeruginosa NOT DETECTED NOT DETECTED   Candida albicans NOT DETECTED NOT DETECTED   Candida glabrata NOT DETECTED NOT DETECTED   Candida krusei NOT DETECTED NOT DETECTED   Candida parapsilosis NOT DETECTED NOT DETECTED   Candida tropicalis NOT DETECTED NOT DETECTED    Name of physician (or Provider) Contacted: Dr. Chancy Milroy  Changes to prescribed antibiotics required: continue ceftriaxone, increase dose to 2gm q24h  Dannielle Huh 05/03/2016  9:44 AM

## 2016-05-04 DIAGNOSIS — A4151 Sepsis due to Escherichia coli [E. coli]: Principal | ICD-10-CM

## 2016-05-04 DIAGNOSIS — R509 Fever, unspecified: Secondary | ICD-10-CM

## 2016-05-04 DIAGNOSIS — N10 Acute tubulo-interstitial nephritis: Secondary | ICD-10-CM

## 2016-05-04 DIAGNOSIS — I10 Essential (primary) hypertension: Secondary | ICD-10-CM

## 2016-05-04 LAB — URINE CULTURE

## 2016-05-04 LAB — CBC
HEMATOCRIT: 34.5 % — AB (ref 36.0–46.0)
Hemoglobin: 12.2 g/dL (ref 12.0–15.0)
MCH: 29.4 pg (ref 26.0–34.0)
MCHC: 35.4 g/dL (ref 30.0–36.0)
MCV: 83.1 fL (ref 78.0–100.0)
PLATELETS: 222 10*3/uL (ref 150–400)
RBC: 4.15 MIL/uL (ref 3.87–5.11)
RDW: 12.2 % (ref 11.5–15.5)
WBC: 12 10*3/uL — AB (ref 4.0–10.5)

## 2016-05-04 LAB — HEMOGLOBIN A1C
Hgb A1c MFr Bld: 5.3 % (ref 4.8–5.6)
MEAN PLASMA GLUCOSE: 105 mg/dL

## 2016-05-04 MED ORDER — KETOROLAC TROMETHAMINE 30 MG/ML IJ SOLN
30.0000 mg | Freq: Four times a day (QID) | INTRAMUSCULAR | Status: DC | PRN
  Administered 2016-05-04 – 2016-05-05 (×2): 30 mg via INTRAVENOUS
  Filled 2016-05-04 (×2): qty 1

## 2016-05-04 NOTE — Progress Notes (Signed)
PROGRESS NOTE    Kristen Klein  GEX:[id number] DOB: 1985-08-31 DOA: 05/02/2016 PCP: Robley Fries, MD    Brief Narrative:  31 year old female past history of obesity and hypertension admitted to the hospitalist service on 4/23 after several days of fevers, chills and dysuria. Found to be in sepsis secondary to pyelonephritis. Started on IV fluids and antibiotics. Morning after admission, blood cultures grew out Escherichia coli.  Patient seen after arrival to floor. Complains of chills. Feeling a little bit better than previous day, but not much. Not much appetite. Occasional episodes of pain in the left flank.  Assessment & Plan:   Principal Problem:   Sepsis due to Escherichia coli (E. coli) (HCC) Active Problems:   Acute lower UTI   Hypokalemia   Acute pyelonephritis   Obesity (BMI 30-39.9)   Essential hypertension  Principal Problem:   Sepsis due to Escherichia coli (E. coli) (HCC) from UTI and bacteremia and/acute pyelonephritis: -Patient is continued on IV Rocephin. Plan a total of 10 days of therapy. -Blood cx pending sensitivities -Repeat CBC in AM  Active Problems: Essential hypertension:  - Antihypertensives on hold until patient more stable. - BP stable at present    Hypokalemia: replaced - Repeat bmet in AM  Obesity: Patient meets criteria for obesity with a BMI greater than 30 - Stable  DVT prophylaxis: SCD's Code Status: Full Family Communication: Pt in room, family at bedside Disposition Plan: Uncertain at this time  Consultants:     Procedures:     Antimicrobials: Anti-infectives    Start     Dose/Rate Route Frequency Ordered Stop   05/03/16 1700  cefTRIAXone (ROCEPHIN) 1 g in dextrose 5 % 50 mL IVPB  Status:  Discontinued     1 g 100 mL/hr over 30 Minutes Intravenous Every 24 hours 05/02/16 1801 05/03/16 0944   05/03/16 1600  cefTRIAXone (ROCEPHIN) 2 g in dextrose 5 % 50 mL IVPB     2 g 100 mL/hr over 30 Minutes Intravenous Every  24 hours 05/03/16 0944     05/02/16 1700  cefTRIAXone (ROCEPHIN) 2 g in dextrose 5 % 50 mL IVPB     2 g 100 mL/hr over 30 Minutes Intravenous  Once 05/02/16 1649 05/02/16 1758       Subjective: Eager to go home  Objective: Vitals:   05/03/16 2153 05/04/16 0157 05/04/16 0551 05/04/16 1400  BP: 104/62 130/73 115/65 116/70  Pulse: 68 97 80 76  Resp: Temp: 98.7 F (37.1 C) (!) 100.6 F (38.1 C) 98.8 F (37.1 C) 98.1 F (36.7 C)  TempSrc: Oral Oral Oral Oral  SpO2: 99% 100% 99% 98%  Weight:      Height:        Intake/Output Summary (Last 24 hours) at 05/04/16 1758 Last data filed at 05/04/16 1727  Gross per 24 hour  Intake              770 ml  Output             1600 ml  Net             -830 ml   Filed Weights   05/02/16 1623 05/03/16 1240  Weight: 79.4 kg (175 lb) 82.2 kg (181 lb 3.5 oz)    Examination:  General exam: Appears calm and comfortable  Respiratory system: Clear to auscultation. Respiratory effort normal. Cardiovascular system: S1 & S2 heard, RRR. Marland Kitchen Gastrointestinal system: Abdomen is nondistended, soft and nontender. No organomegaly  or masses felt. Normal bowel sounds heard. Central nervous system: Alert and oriented. No focal neurological deficits. Extremities: Symmetric 5 x 5 power. Skin: No rashes, lesions Psychiatry: Judgement and insight appear normal. Mood & affect appropriate.   Data Reviewed: I have personally reviewed following labs and imaging studies  CBC:  Recent Labs Lab 05/01/16 1120 05/02/16 1739 05/03/16 0435 05/04/16 0824  WBC 10.4 11.1* 13.0* 12.0*  NEUTROABS 6.9 9.1*  --   --   HGB 13.4 12.6 13.1 12.2  HCT 37.3 36.1 37.6 34.5*  MCV 84.8 83.6 86.6 83.1  PLT 234 196 205 222   Basic Metabolic Panel:  Recent Labs Lab 05/01/16 1120 05/02/16 1739 05/03/16 0455  NA 135 135 137  K 3.3* 3.2* 4.0  CL 101 101 104  CO2 GLUCOSE 103* 107* 106*  BUN CREATININE 0.72 0.84 0.73  CALCIUM 8.9 9.0  8.1*  MG  --   --  1.9  PHOS  --   --  2.3*   GFR: Estimated Creatinine Clearance: 97.7 mL/min (by C-G formula based on SCr of 0.73 mg/dL). Liver Function Tests:  Recent Labs Lab 05/02/16 1739 05/03/16 0455  AST 35 52*  ALT 54 79*  ALKPHOS 67 72  BILITOT 0.9 1.0  PROT 8.1 7.5  ALBUMIN 4.4 3.6    Recent Labs Lab 05/01/16 1120  LIPASE 19   No results for input(s): AMMONIA in the last 168 hours. Coagulation Profile:  Recent Labs Lab 05/03/16 0135  INR 1.09   Cardiac Enzymes: No results for input(s): CKTOTAL, CKMB, CKMBINDEX, TROPONINI in the last 168 hours. BNP (last 3 results) No results for input(s): PROBNP in the last 8760 hours. HbA1C:  Recent Labs  05/03/16 0435  HGBA1C 5.3   CBG: No results for input(s): GLUCAP in the last 168 hours. Lipid Profile: No results for input(s): CHOL, HDL, LDLCALC, TRIG, CHOLHDL, LDLDIRECT in the last 72 hours. Thyroid Function Tests:  Recent Labs  05/03/16 0435  TSH 4.147   Anemia Panel: No results for input(s): VITAMINB12, FOLATE, FERRITIN, TIBC, IRON, RETICCTPCT in the last 72 hours. Sepsis Labs:  Recent Labs Lab 05/02/16 1725 05/03/16 0135 05/03/16 0435  PROCALCITON  --  2.70  --   LATICACIDVEN 1.67 0.6 0.8    Recent Results (from the past 240 hour(s))  Wet prep, genital     Status: Abnormal   Collection Time: 05/01/16 12:09 PM  Result Value Ref Range Status   Yeast Wet Prep HPF POC NONE SEEN NONE SEEN Final   Trich, Wet Prep NONE SEEN NONE SEEN Final   Clue Cells Wet Prep HPF POC NONE SEEN NONE SEEN Final   WBC, Wet Prep HPF POC MANY (A) NONE SEEN Final   Sperm NONE SEEN  Final  Urine C&S     Status: Abnormal   Collection Time: 05/02/16  4:25 PM  Result Value Ref Range Status   Specimen Description URINE, CLEAN CATCH  Final   Special Requests NONE  Final   Culture MULTIPLE SPECIES PRESENT, SUGGEST RECOLLECTION (A)  Final   Report Status 05/04/2016 FINAL  Final  Urine culture     Status: Abnormal    Collection Time: 05/02/16  4:25 PM  Result Value Ref Range Status   Specimen Description URINE, RANDOM  Final   Special Requests NONE  Final   Culture MULTIPLE SPECIES PRESENT, SUGGEST RECOLLECTION (A)  Final   Report Status 05/04/2016 FINAL  Final  Blood Culture (routine x  2)     Status: Abnormal (Preliminary result)   Collection Time: 05/02/16  5:39 PM  Result Value Ref Range Status   Specimen Description BLOOD RIGHT ANTECUBITAL  Final   Special Requests   Final    BOTTLES DRAWN AEROBIC AND ANAEROBIC Blood Culture adequate volume   Culture  Setup Time   Final    GRAM NEGATIVE RODS IN BOTH AEROBIC AND ANAEROBIC BOTTLES CRITICAL RESULT CALLED TO, READ BACK BY AND VERIFIED WITH: Azzie Glatter Pharm.D. 9:35 05/03/16 ([email or web address])    Culture (A)  Final    ESCHERICHIA COLI SUSCEPTIBILITIES TO FOLLOW Performed at [facility], [address] [place], [place] [address]    Report Status PENDING  Incomplete  Blood Culture ID Panel (Reflexed)     Status: Abnormal   Collection Time: 05/02/16  5:39 PM  Result Value Ref Range Status   Enterococcus species NOT DETECTED NOT DETECTED Final   Listeria monocytogenes NOT DETECTED NOT DETECTED Final   Staphylococcus species NOT DETECTED NOT DETECTED Final   Staphylococcus aureus NOT DETECTED NOT DETECTED Final   Streptococcus species NOT DETECTED NOT DETECTED Final   Streptococcus agalactiae NOT DETECTED NOT DETECTED Final   Streptococcus pneumoniae NOT DETECTED NOT DETECTED Final   Streptococcus pyogenes NOT DETECTED NOT DETECTED Final   Acinetobacter baumannii NOT DETECTED NOT DETECTED Final   Enterobacteriaceae species DETECTED (A) NOT DETECTED Final    Comment: Enterobacteriaceae represent a large family of gram-negative bacteria, not a single organism. CRITICAL RESULT CALLED TO, READ BACK BY AND VERIFIED WITH: Azzie Glatter Pharm.D. 9:35 05/03/16 ([email or web address])    Enterobacter cloacae complex NOT DETECTED NOT DETECTED Final   Escherichia coli DETECTED  (A) NOT DETECTED Final    Comment: CRITICAL RESULT CALLED TO, READ BACK BY AND VERIFIED WITH: Mardee Postin.D. 9:35 05/03/16 ([email or web address])    Klebsiella oxytoca NOT DETECTED NOT DETECTED Final   Klebsiella pneumoniae NOT DETECTED NOT DETECTED Final   Proteus species NOT DETECTED NOT DETECTED Final   Serratia marcescens NOT DETECTED NOT DETECTED Final   Carbapenem resistance NOT DETECTED NOT DETECTED Final   Haemophilus influenzae NOT DETECTED NOT DETECTED Final   Neisseria meningitidis NOT DETECTED NOT DETECTED Final   Pseudomonas aeruginosa NOT DETECTED NOT DETECTED Final   Candida albicans NOT DETECTED NOT DETECTED Final   Candida glabrata NOT DETECTED NOT DETECTED Final   Candida krusei NOT DETECTED NOT DETECTED Final   Candida parapsilosis NOT DETECTED NOT DETECTED Final   Candida tropicalis NOT DETECTED NOT DETECTED Final    Comment: Performed at [facility], [address], [place], [place] [address]  Blood Culture (routine x 2)     Status: None (Preliminary result)   Collection Time: 05/02/16  5:50 PM  Result Value Ref Range Status   Specimen Description BLOOD LEFT ANTECUBITAL  Final   Special Requests   Final    BOTTLES DRAWN AEROBIC AND ANAEROBIC Blood Culture adequate volume   Culture   Final    NO GROWTH 2 DAYS Performed at [facility], [address] [place], [place] [address]    Report Status PENDING  Incomplete     Radiology Studies: Dg Chest Port 1 View  Result Date: 05/03/2016 CLINICAL DATA:  Acute onset of fever.  Sepsis.  Initial encounter. EXAM: PORTABLE CHEST 1 VIEW COMPARISON:  Chest radiograph performed 12/21/2015 FINDINGS: The lungs are well-aerated and clear. There is no evidence of focal opacification, pleural effusion or pneumothorax. The cardiomediastinal silhouette is within normal  limits. No acute osseous abnormalities are seen. IMPRESSION: No acute cardiopulmonary process seen. Electronically Signed   By: Roanna Raider M.D.   On: 05/03/2016  01:26    Scheduled Meds: . FLUoxetine  20 mg Oral QHS  . sodium chloride flush  3 mL Intravenous Q12H   Continuous Infusions: . cefTRIAXone (ROCEPHIN)  IV Stopped (05/04/16 1727)     LOS: 2 days   Aanvi Voyles, Scheryl Marten, MD [facility] Pager [phone]  If 7PM-7AM, please contact night-coverage [email or web address] Password [facility] 05/04/2016, 5:58 PM

## 2016-05-05 LAB — CBC
HCT: 34.9 % — ABNORMAL LOW (ref 36.0–46.0)
Hemoglobin: 12.2 g/dL (ref 12.0–15.0)
MCH: 29 pg (ref 26.0–34.0)
MCHC: 35 g/dL (ref 30.0–36.0)
MCV: 83.1 fL (ref 78.0–100.0)
PLATELETS: 267 10*3/uL (ref 150–400)
RBC: 4.2 MIL/uL (ref 3.87–5.11)
RDW: 12.2 % (ref 11.5–15.5)
WBC: 9.7 10*3/uL (ref 4.0–10.5)

## 2016-05-05 LAB — CULTURE, BLOOD (ROUTINE X 2): Special Requests: ADEQUATE

## 2016-05-05 LAB — URINE CULTURE: Culture: NO GROWTH

## 2016-05-05 MED ORDER — CEFPODOXIME PROXETIL 200 MG PO TABS
200.0000 mg | ORAL_TABLET | Freq: Two times a day (BID) | ORAL | Status: DC
  Administered 2016-05-05: 200 mg via ORAL
  Filled 2016-05-05: qty 1

## 2016-05-05 MED ORDER — CEFPODOXIME PROXETIL 200 MG PO TABS: 200.0000 mg | ORAL_TABLET | Freq: Two times a day (BID) | ORAL | 0 refills | Status: AC

## 2016-05-05 MED ORDER — ONDANSETRON HCL 4 MG PO TABS: 4.0000 mg | ORAL_TABLET | Freq: Four times a day (QID) | ORAL | 0 refills | Status: AC | PRN

## 2016-05-05 NOTE — Discharge Summary (Signed)
Physician Discharge Summary  Kristen Klein ZOX:[id number] DOB: 1985/10/29 DOA: 05/02/2016  PCP: Robley Fries, MD  Admit date: 05/02/2016 Discharge date: 05/05/2016  Admitted From: Home Disposition:  Home  Recommendations for Outpatient Follow-up:  1. Follow up with PCP in 2-3 weeks 2. Follow up with Urology as scheduled  Discharge Condition:Improved CODE STATUS:Full Diet recommendation: Regular   Brief/Interim Summary: 31 year old female past history of obesity and hypertension admitted to the hospitalist service on 4/23 after several days of fevers, chills and dysuria. Found to be in sepsis secondary to pyelonephritis. Started on IV fluids and antibiotics. Morning after admission, blood cultures grew out Escherichia coli.  Patient seen after arrival to floor. Complains of chills. Feeling a little bit better than previous day, but not much. Not much appetite. Occasional episodes of pain in the left flank.   Principal Problem: Sepsis due to Escherichia coli (E. coli) (HCC)from UTI and bacteremia and/acute pyelonephritis: -Patient was continued on IV Rocephin. Plan a total of 10 days of therapy. -Blood cx pos for pan-sensitive ecoli -Transition to PO vantin to complete course (7 more days)  Active Problems: Essential hypertension:  - Antihypertensives on hold until patient more stable. - BP stable at present  Hypokalemia: replaced - Repeat bmet in AM  Obesity: Patient meets criteria for obesity with a BMI greater than 30 - Stable  Discharge Diagnoses:  Principal Problem:   Sepsis due to Escherichia coli (E. coli) (HCC) Active Problems:   Acute lower UTI   Hypokalemia   Acute pyelonephritis   Obesity (BMI 30-39.9)   Essential hypertension    Discharge Instructions   Allergies as of 05/05/2016   No Known Allergies     Medication List    STOP taking these medications   cephALEXin 500 MG capsule Commonly known as:  KEFLEX    oxyCODONE-acetaminophen 5-325 MG tablet Commonly known as:  PERCOCET/ROXICET     TAKE these medications   ALPRAZolam 0.5 MG tablet Commonly known as:  XANAX Take 0.5 mg by mouth 2 (two) times daily as needed for anxiety.   Biotin 1000 MCG tablet Take 5,000 mcg by mouth daily.   cefpodoxime 200 MG tablet Commonly known as:  VANTIN Take 1 tablet (200 mg total) by mouth every 12 (twelve) hours.   cholecalciferol 1000 units tablet Commonly known as:  VITAMIN D Take 2,000 Units by mouth daily.   FLUoxetine 20 MG capsule Commonly known as:  PROZAC Take 20 mg by mouth at bedtime.   hydrochlorothiazide 25 MG tablet Commonly known as:  HYDRODIURIL Take 25 mg by mouth at bedtime.   ibuprofen 200 MG tablet Commonly known as:  ADVIL,MOTRIN Take 600-800 mg by mouth every 6 (six) hours as needed for moderate pain.   irbesartan 75 MG tablet Commonly known as:  AVAPRO Take 75 mg by mouth daily.   ondansetron 4 MG tablet Commonly known as:  ZOFRAN Take 1 tablet (4 mg total) by mouth every 6 (six) hours as needed for nausea.   zolpidem 5 MG tablet Commonly known as:  AMBIEN Take 5 mg by mouth at bedtime as needed for sleep.       No Known Allergies  Consultations:  Urology  Procedures/Studies: Dg Chest Port 1 View  Result Date: 05/03/2016 CLINICAL DATA:  Acute onset of fever.  Sepsis.  Initial encounter. EXAM: PORTABLE CHEST 1 VIEW COMPARISON:  Chest radiograph performed 12/21/2015 FINDINGS: The lungs are well-aerated and clear. There is no evidence of focal opacification, pleural effusion or pneumothorax. The cardiomediastinal  silhouette is within normal limits. No acute osseous abnormalities are seen. IMPRESSION: No acute cardiopulmonary process seen. Electronically Signed   By: Roanna Raider M.D.   On: 05/03/2016 01:26   Ct Renal Stone Study  Result Date: 05/01/2016 CLINICAL DATA:  Left flank pain beginning last night. Fever and chills. EXAM: CT ABDOMEN AND PELVIS  WITHOUT CONTRAST TECHNIQUE: Multidetector CT imaging of the abdomen and pelvis was performed following the standard protocol without IV contrast. COMPARISON:  03/26/2014 FINDINGS: Lower chest: The visualized lung bases are clear. Hepatobiliary: Diffusely decreased attenuation of the liver consistent with steatosis. Prior cholecystectomy. No biliary dilatation. Pancreas: Unremarkable. Spleen: Unremarkable. Adrenals/Urinary Tract: Unremarkable adrenal glands. Punctate nonobstructing calculus in the lower pole of the left kidney (series 5, image 89). Two suspected punctate nonobstructing calculi in the interpolar region/lower pole of the right kidney. No right-sided hydronephrosis. No left-sided caliectasis, however there is mild left pelviectasis and moderate proximal hydroureter. The left ureter demonstrates an abrupt transition point in its proximal to midportion and is decompressed distally (series 2, image 41 and series 6, image 116). The proximal ureter is mildly prominent on multiple prior studies with an abrupt tapering at the same level, although proximal dilatation is greater on today's examination. No ureteral calculi are identified. There is an accessory renal vein draining the lower pole of the left kidney which crosses posterior to the ureter at the transition point, with the gonadal vein being immediately anterior to the ureter at this level. No perinephric stranding. The bladder is unremarkable. Stomach/Bowel: The stomach is within normal limits. There is no evidence of bowel obstruction or inflammation. The appendix is unremarkable. Vascular/Lymphatic: Normal caliber of the abdominal aorta. No enlarged lymph nodes. Reproductive: Unremarkable uterus and left ovary. 2.4 cm right adnexal cyst has a benign appearance. Prior Essure sterilization. Other: No intraperitoneal free fluid. No abdominal wall mass or hernia. Musculoskeletal: No acute osseous abnormality or suspicious osseous lesion. IMPRESSION: 1.  Mild left pelviectasis and proximal hydroureter without obstructing stone identified. This could reflect recent stone passage or infection. The proximal to mid ureter abruptly transitions in caliber with a similar though less prominent appearance on multiple prior examinations. This could reflect an underlying stricture or compression between the gonadal vein and an accessory renal vein. Contrast-enhanced hematuria protocol CT is recommended for further evaluation but may be performed as an outpatient unless more immediate evaluation is clinically necessary. 2. Hepatic steatosis. Electronically Signed   By: Sebastian Ache M.D.   On: 05/01/2016 13:30    Subjective: Eager to go home  Discharge Exam: Vitals:   05/04/16 2051 05/05/16 0455  BP: 126/89 128/82  Pulse: 91 80  Resp: 18 18  Temp: 98.7 F (37.1 C) 98.8 F (37.1 C)   Vitals:   05/04/16 0551 05/04/16 1400 05/04/16 2051 05/05/16 0455  BP: 115/65 116/70 126/89 128/82  Pulse: 80 76 91 80  Resp: Temp: 98.8 F (37.1 C) 98.1 F (36.7 C) 98.7 F (37.1 C) 98.8 F (37.1 C)  TempSrc: Oral Oral Oral Oral  SpO2: 99% 98% 92% 100%  Weight:      Height:        General: Pt is alert, awake, not in acute distress Cardiovascular: RRR, S1/S2 +, no rubs, no gallops Respiratory: CTA bilaterally, no wheezing, no rhonchi Abdominal: Soft, NT, ND, bowel sounds + Extremities: no edema, no cyanosis   The results of significant diagnostics from this hospitalization (including imaging, microbiology, ancillary and laboratory) are listed below for  reference.     Microbiology: Recent Results (from the past 240 hour(s))  Wet prep, genital     Status: Abnormal   Collection Time: 05/01/16 12:09 PM  Result Value Ref Range Status   Yeast Wet Prep HPF POC NONE SEEN NONE SEEN Final   Trich, Wet Prep NONE SEEN NONE SEEN Final   Clue Cells Wet Prep HPF POC NONE SEEN NONE SEEN Final   WBC, Wet Prep HPF POC MANY (A) NONE SEEN Final   Sperm NONE  SEEN  Final  Urine C&S     Status: Abnormal   Collection Time: 05/02/16  4:25 PM  Result Value Ref Range Status   Specimen Description URINE, CLEAN CATCH  Final   Special Requests NONE  Final   Culture MULTIPLE SPECIES PRESENT, SUGGEST RECOLLECTION (A)  Final   Report Status 05/04/2016 FINAL  Final  Urine culture     Status: Abnormal   Collection Time: 05/02/16  4:25 PM  Result Value Ref Range Status   Specimen Description URINE, RANDOM  Final   Special Requests NONE  Final   Culture MULTIPLE SPECIES PRESENT, SUGGEST RECOLLECTION (A)  Final   Report Status 05/04/2016 FINAL  Final  Blood Culture (routine x 2)     Status: Abnormal   Collection Time: 05/02/16  5:39 PM  Result Value Ref Range Status   Specimen Description BLOOD RIGHT ANTECUBITAL  Final   Special Requests   Final    BOTTLES DRAWN AEROBIC AND ANAEROBIC Blood Culture adequate volume   Culture  Setup Time   Final    GRAM NEGATIVE RODS IN BOTH AEROBIC AND ANAEROBIC BOTTLES CRITICAL RESULT CALLED TO, READ BACK BY AND VERIFIED WITH: Azzie Glatter Pharm.D. 9:35 05/03/16 ([email or web address]) Performed at [facility], [address] [place], [place] [address]    Culture ESCHERICHIA COLI (A)  Final   Report Status 05/05/2016 FINAL  Final   Organism ID, Bacteria ESCHERICHIA COLI  Final      Susceptibility   Escherichia coli - MIC*    AMPICILLIN <=2 SENSITIVE Sensitive     CEFAZOLIN <=4 SENSITIVE Sensitive     CEFEPIME <=1 SENSITIVE Sensitive     CEFTAZIDIME <=1 SENSITIVE Sensitive     CEFTRIAXONE <=1 SENSITIVE Sensitive     CIPROFLOXACIN <=0.25 SENSITIVE Sensitive     GENTAMICIN <=1 SENSITIVE Sensitive     IMIPENEM <=0.25 SENSITIVE Sensitive     TRIMETH/SULFA <=20 SENSITIVE Sensitive     AMPICILLIN/SULBACTAM <=2 SENSITIVE Sensitive     PIP/TAZO <=4 SENSITIVE Sensitive     Extended ESBL NEGATIVE Sensitive     * ESCHERICHIA COLI  Blood Culture ID Panel (Reflexed)     Status: Abnormal   Collection Time: 05/02/16  5:39 PM  Result  Value Ref Range Status   Enterococcus species NOT DETECTED NOT DETECTED Final   Listeria monocytogenes NOT DETECTED NOT DETECTED Final   Staphylococcus species NOT DETECTED NOT DETECTED Final   Staphylococcus aureus NOT DETECTED NOT DETECTED Final   Streptococcus species NOT DETECTED NOT DETECTED Final   Streptococcus agalactiae NOT DETECTED NOT DETECTED Final   Streptococcus pneumoniae NOT DETECTED NOT DETECTED Final   Streptococcus pyogenes NOT DETECTED NOT DETECTED Final   Acinetobacter baumannii NOT DETECTED NOT DETECTED Final   Enterobacteriaceae species DETECTED (A) NOT DETECTED Final    Comment: Enterobacteriaceae represent a large family of gram-negative bacteria, not a single organism. CRITICAL RESULT CALLED TO, READ BACK BY AND VERIFIED WITH: Azzie Glatter Pharm.D. 9:35 05/03/16 ([email or web address])  Enterobacter cloacae complex NOT DETECTED NOT DETECTED Final   Escherichia coli DETECTED (A) NOT DETECTED Final    Comment: CRITICAL RESULT CALLED TO, READ BACK BY AND VERIFIED WITH: Azzie Glatter Pharm.D. 9:35 05/03/16 ([email or web address])    Klebsiella oxytoca NOT DETECTED NOT DETECTED Final   Klebsiella pneumoniae NOT DETECTED NOT DETECTED Final   Proteus species NOT DETECTED NOT DETECTED Final   Serratia marcescens NOT DETECTED NOT DETECTED Final   Carbapenem resistance NOT DETECTED NOT DETECTED Final   Haemophilus influenzae NOT DETECTED NOT DETECTED Final   Neisseria meningitidis NOT DETECTED NOT DETECTED Final   Pseudomonas aeruginosa NOT DETECTED NOT DETECTED Final   Candida albicans NOT DETECTED NOT DETECTED Final   Candida glabrata NOT DETECTED NOT DETECTED Final   Candida krusei NOT DETECTED NOT DETECTED Final   Candida parapsilosis NOT DETECTED NOT DETECTED Final   Candida tropicalis NOT DETECTED NOT DETECTED Final    Comment: Performed at [facility], [address], [place], [place] [address]  Blood Culture (routine x 2)     Status: None (Preliminary result)   Collection Time: 05/02/16   5:50 PM  Result Value Ref Range Status   Specimen Description BLOOD LEFT ANTECUBITAL  Final   Special Requests   Final    BOTTLES DRAWN AEROBIC AND ANAEROBIC Blood Culture adequate volume   Culture   Final    NO GROWTH 2 DAYS Performed at [facility], [address], [place], [place] [address]    Report Status PENDING  Incomplete  Culture, Urine     Status: None   Collection Time: 05/04/16 12:48 PM  Result Value Ref Range Status   Specimen Description URINE, CLEAN CATCH  Final   Special Requests NONE  Final   Culture   Final    NO GROWTH Performed at [facility], [address], [place], [place] [address]    Report Status 05/05/2016 FINAL  Final     Labs: BNP (last 3 results) No results for input(s): BNP in the last 8760 hours. Basic Metabolic Panel:  Recent Labs Lab 05/01/16 1120 05/02/16 1739 05/03/16 0455  NA 135 135 137  K 3.3* 3.2* 4.0  CL 101 101 104  CO2 GLUCOSE 103* 107* 106*  BUN CREATININE 0.72 0.84 0.73  CALCIUM 8.9 9.0 8.1*  MG  --   --  1.9  PHOS  --   --  2.3*   Liver Function Tests:  Recent Labs Lab 05/02/16 1739 05/03/16 0455  AST 35 52*  ALT 54 79*  ALKPHOS 67 72  BILITOT 0.9 1.0  PROT 8.1 7.5  ALBUMIN 4.4 3.6    Recent Labs Lab 05/01/16 1120  LIPASE 19   No results for input(s): AMMONIA in the last 168 hours. CBC:  Recent Labs Lab 05/01/16 1120 05/02/16 1739 05/03/16 0435 05/04/16 0824 05/05/16 0644  WBC 10.4 11.1* 13.0* 12.0* 9.7  NEUTROABS 6.9 9.1*  --   --   --   HGB 13.4 12.6 13.1 12.2 12.2  HCT 37.3 36.1 37.6 34.5* 34.9*  MCV 84.8 83.6 86.6 83.1 83.1  PLT 234 196 205 222 267   Cardiac Enzymes: No results for input(s): CKTOTAL, CKMB, CKMBINDEX, TROPONINI in the last 168 hours. BNP: Invalid input(s): POCBNP CBG: No results for input(s): GLUCAP in the last 168 hours. D-Dimer No results for input(s): DDIMER in the last 72 hours. Hgb A1c  Recent Labs  05/03/16 0435  HGBA1C  5.3    Lipid Profile No results for input(s): CHOL, HDL, LDLCALC, TRIG, CHOLHDL, LDLDIRECT in the last 72 hours. Thyroid function studies  Recent Labs  05/03/16 0435  TSH 4.147   Anemia work up No results for input(s): VITAMINB12, FOLATE, FERRITIN, TIBC, IRON, RETICCTPCT in the last 72 hours. Urinalysis    Component Value Date/Time   COLORURINE YELLOW 05/02/2016 1625   APPEARANCEUR CLEAR 05/02/2016 1625   LABSPEC 1.011 05/02/2016 1625   PHURINE 7.0 05/02/2016 1625   GLUCOSEU NEGATIVE 05/02/2016 1625   HGBUR MODERATE (A) 05/02/2016 1625   BILIRUBINUR NEGATIVE 05/02/2016 1625   BILIRUBINUR neg 07/04/2012 1838   KETONESUR NEGATIVE 05/02/2016 1625   PROTEINUR NEGATIVE 05/02/2016 1625   UROBILINOGEN 0.2 03/26/2014 1055   NITRITE POSITIVE (A) 05/02/2016 1625   LEUKOCYTESUR SMALL (A) 05/02/2016 1625   Sepsis Labs Invalid input(s): PROCALCITONIN,  WBC,  LACTICIDVEN Microbiology Recent Results (from the past 240 hour(s))  Wet prep, genital     Status: Abnormal   Collection Time: 05/01/16 12:09 PM  Result Value Ref Range Status   Yeast Wet Prep HPF POC NONE SEEN NONE SEEN Final   Trich, Wet Prep NONE SEEN NONE SEEN Final   Clue Cells Wet Prep HPF POC NONE SEEN NONE SEEN Final   WBC, Wet Prep HPF POC MANY (A) NONE SEEN Final   Sperm NONE SEEN  Final  Urine C&S     Status: Abnormal   Collection Time: 05/02/16  4:25 PM  Result Value Ref Range Status   Specimen Description URINE, CLEAN CATCH  Final   Special Requests NONE  Final   Culture MULTIPLE SPECIES PRESENT, SUGGEST RECOLLECTION (A)  Final   Report Status 05/04/2016 FINAL  Final  Urine culture     Status: Abnormal   Collection Time: 05/02/16  4:25 PM  Result Value Ref Range Status   Specimen Description URINE, RANDOM  Final   Special Requests NONE  Final   Culture MULTIPLE SPECIES PRESENT, SUGGEST RECOLLECTION (A)  Final   Report Status 05/04/2016 FINAL  Final  Blood Culture (routine x 2)     Status: Abnormal   Collection  Time: 05/02/16  5:39 PM  Result Value Ref Range Status   Specimen Description BLOOD RIGHT ANTECUBITAL  Final   Special Requests   Final    BOTTLES DRAWN AEROBIC AND ANAEROBIC Blood Culture adequate volume   Culture  Setup Time   Final    GRAM NEGATIVE RODS IN BOTH AEROBIC AND ANAEROBIC BOTTLES CRITICAL RESULT CALLED TO, READ BACK BY AND VERIFIED WITH: Azzie Glatter Pharm.D. 9:35 05/03/16 ([email or web address]) Performed at [facility], [address] [place], [place] [address]    Culture ESCHERICHIA COLI (A)  Final   Report Status 05/05/2016 FINAL  Final   Organism ID, Bacteria ESCHERICHIA COLI  Final      Susceptibility   Escherichia coli - MIC*    AMPICILLIN <=2 SENSITIVE Sensitive     CEFAZOLIN <=4 SENSITIVE Sensitive     CEFEPIME <=1 SENSITIVE Sensitive     CEFTAZIDIME <=1 SENSITIVE Sensitive     CEFTRIAXONE <=1 SENSITIVE Sensitive     CIPROFLOXACIN <=0.25 SENSITIVE Sensitive     GENTAMICIN <=1 SENSITIVE Sensitive     IMIPENEM <=0.25 SENSITIVE Sensitive     TRIMETH/SULFA <=20 SENSITIVE Sensitive     AMPICILLIN/SULBACTAM <=2 SENSITIVE Sensitive     PIP/TAZO <=4 SENSITIVE Sensitive     Extended ESBL NEGATIVE Sensitive     * ESCHERICHIA COLI  Blood Culture  ID Panel (Reflexed)     Status: Abnormal   Collection Time: 05/02/16  5:39 PM  Result Value Ref Range Status   Enterococcus species NOT DETECTED NOT DETECTED Final   Listeria monocytogenes NOT DETECTED NOT DETECTED Final   Staphylococcus species NOT DETECTED NOT DETECTED Final   Staphylococcus aureus NOT DETECTED NOT DETECTED Final   Streptococcus species NOT DETECTED NOT DETECTED Final   Streptococcus agalactiae NOT DETECTED NOT DETECTED Final   Streptococcus pneumoniae NOT DETECTED NOT DETECTED Final   Streptococcus pyogenes NOT DETECTED NOT DETECTED Final   Acinetobacter baumannii NOT DETECTED NOT DETECTED Final   Enterobacteriaceae species DETECTED (A) NOT DETECTED Final    Comment: Enterobacteriaceae represent a large family of  gram-negative bacteria, not a single organism. CRITICAL RESULT CALLED TO, READ BACK BY AND VERIFIED WITH: Azzie Glatter Pharm.D. 9:35 05/03/16 ([email or web address])    Enterobacter cloacae complex NOT DETECTED NOT DETECTED Final   Escherichia coli DETECTED (A) NOT DETECTED Final    Comment: CRITICAL RESULT CALLED TO, READ BACK BY AND VERIFIED WITH: Mardee Postin.D. 9:35 05/03/16 ([email or web address])    Klebsiella oxytoca NOT DETECTED NOT DETECTED Final   Klebsiella pneumoniae NOT DETECTED NOT DETECTED Final   Proteus species NOT DETECTED NOT DETECTED Final   Serratia marcescens NOT DETECTED NOT DETECTED Final   Carbapenem resistance NOT DETECTED NOT DETECTED Final   Haemophilus influenzae NOT DETECTED NOT DETECTED Final   Neisseria meningitidis NOT DETECTED NOT DETECTED Final   Pseudomonas aeruginosa NOT DETECTED NOT DETECTED Final   Candida albicans NOT DETECTED NOT DETECTED Final   Candida glabrata NOT DETECTED NOT DETECTED Final   Candida krusei NOT DETECTED NOT DETECTED Final   Candida parapsilosis NOT DETECTED NOT DETECTED Final   Candida tropicalis NOT DETECTED NOT DETECTED Final    Comment: Performed at [facility], [address] [place], [place] [address]  Blood Culture (routine x 2)     Status: None (Preliminary result)   Collection Time: 05/02/16  5:50 PM  Result Value Ref Range Status   Specimen Description BLOOD LEFT ANTECUBITAL  Final   Special Requests   Final    BOTTLES DRAWN AEROBIC AND ANAEROBIC Blood Culture adequate volume   Culture   Final    NO GROWTH 2 DAYS Performed at [facility], [address] [place], [place] [address]    Report Status PENDING  Incomplete  Culture, Urine     Status: None   Collection Time: 05/04/16 12:48 PM  Result Value Ref Range Status   Specimen Description URINE, CLEAN CATCH  Final   Special Requests NONE  Final   Culture   Final    NO GROWTH Performed at [facility], [address] [place], [place] [address]    Report Status  05/05/2016 FINAL  Final     SIGNED:   Jerald Kief, MD  [facility] 05/05/2016, 12:27 PM  If 7PM-7AM, please contact night-coverage [email or web address] Password [email or web address]

## 2016-05-05 NOTE — Care Management Note (Signed)
Case Management Note  Patient Details  Name: Kristen Klein MRN: [id number] Date of Birth: 04-27-1985  Subjective/Objective:                    Action/Plan:d/c home.   Expected Discharge Date:  05/05/16               Expected Discharge Plan:  Home/Self Care  In-House Referral:     Discharge planning Services  CM Consult  Post Acute Care Choice:    Choice offered to:     DME Arranged:    DME Agency:     HH Arranged:    HH Agency:     Status of Service:  Completed, signed off  If discussed at [employer] of Stay Meetings, dates discussed:    Additional Comments:  Lanier Clam, RN 05/05/2016, 12:37 PM

## 2016-05-05 NOTE — Progress Notes (Signed)
Awaiting for ride

## 2016-05-07 LAB — CULTURE, BLOOD (ROUTINE X 2)
Culture: NO GROWTH
SPECIAL REQUESTS: ADEQUATE

## 2016-05-09 ENCOUNTER — Other Ambulatory Visit (HOSPITAL_COMMUNITY): Payer: Self-pay | Admitting: Urology

## 2016-05-09 DIAGNOSIS — N1330 Unspecified hydronephrosis: Secondary | ICD-10-CM

## 2016-07-29 ENCOUNTER — Ambulatory Visit (HOSPITAL_COMMUNITY)
Admission: RE | Admit: 2016-07-29 | Discharge: 2016-07-29 | Disposition: A | Discharge status: Home / Self Care | Payer: BLUE CROSS/BLUE SHIELD | Source: Ambulatory Visit | Attending: Urology | Admitting: Urology

## 2016-07-29 DIAGNOSIS — N1330 Unspecified hydronephrosis: Secondary | ICD-10-CM | POA: Diagnosis not present

## 2016-07-29 MED ORDER — FUROSEMIDE 10 MG/ML IJ SOLN
40.0000 mg | Freq: Once | INTRAMUSCULAR | Status: AC
  Administered 2016-07-29: 40 mg via INTRAVENOUS

## 2016-07-29 MED ORDER — FUROSEMIDE 10 MG/ML IJ SOLN
INTRAMUSCULAR | Status: AC
  Filled 2016-07-29: qty 4

## 2016-07-29 MED ORDER — TECHNETIUM TC 99M MERTIATIDE: 5.0000 | Freq: Once | INTRAVENOUS | Status: DC | PRN

## 2016-08-08 ENCOUNTER — Encounter: Payer: Self-pay | Admitting: Pediatrics

## 2016-08-08 ENCOUNTER — Ambulatory Visit (INDEPENDENT_AMBULATORY_CARE_PROVIDER_SITE_OTHER): Payer: BLUE CROSS/BLUE SHIELD | Admitting: Pediatrics

## 2016-08-08 VITALS — BP 146/82 | HR 78 | Temp 98.1°F | Resp 18 | Ht 61.0 in | Wt 179.2 lb

## 2016-08-08 DIAGNOSIS — I10 Essential (primary) hypertension: Secondary | ICD-10-CM | POA: Diagnosis not present

## 2016-08-08 DIAGNOSIS — L253 Unspecified contact dermatitis due to other chemical products: Secondary | ICD-10-CM | POA: Diagnosis not present

## 2016-08-08 NOTE — Patient Instructions (Signed)
Patch testing instructions given Return in 48 hours for the first reading of her patch testing

## 2016-08-08 NOTE — Progress Notes (Signed)
[address] [place] [address] Dept: [phone]  New Patient Note  Patient ID: Kristen Klein, female    DOB: 1986/01/07  Age: 31 y.o. MRN: [id number] Date of Office Visit: 08/08/2016 Referring provider: Shea EvansMody, Vaishali, MD [address] [place], [place] [id number]    Chief Complaint: Allergic Reaction (question Nickle allergy.  Patient has Essure Coils (For Tubal) in falopian tubes for sterilazition.  Pelvic pain, fatigue, nausea and irregular bleeding.Marland Kitchen.  )  HPI Kristen Klein presents for patch testing to metals In 2013, she had Essure Coils placed in her Fallopian tubes for sterilization . During the past 5 months she has had lower abdominal pain, fatigue, nausea and irregular bleeding. She is referred for patch testing. She has a history of a contact allergy to jewelry. She does not have a history of eczema, asthma, allergic rhinitis or chronic urticaria.  Review of Systems  Constitutional: Negative.   HENT: Negative.   Eyes: Negative.   Respiratory: Negative.   Cardiovascular:       Hypertension  Gastrointestinal:       Cholecystectomy  Genitourinary:       Pelvic pain, fatigue, nausea for 5 months. Coil  sterilization in 2013. History of sepsis from a urinary tract infection  Musculoskeletal: Negative.   Skin:       Contact allergy to jewelry  Neurological: Negative.   Endo/Heme/Allergies:       No diabetes or thyroid disease  Psychiatric/Behavioral: Negative.     Outpatient Encounter Prescriptions as of 08/08/2016  Medication Sig  . ALPRAZolam (XANAX) 0.5 MG tablet Take 0.5 mg by mouth 2 (two) times daily as needed for anxiety.   . Biotin 1000 MCG tablet Take 5,000 mcg by mouth daily.   . cholecalciferol (VITAMIN D) 1000 units tablet Take 2,000 Units by mouth daily.  Marland Kitchen. FLUoxetine (PROZAC) 20 MG capsule Take 20 mg by mouth at bedtime.   . hydrochlorothiazide (HYDRODIURIL) 25 MG tablet Take 25 mg by mouth at bedtime.   Marland Kitchen. ibuprofen (ADVIL,MOTRIN) 200 MG  tablet Take 600-800 mg by mouth every 6 (six) hours as needed for moderate pain.   Marland Kitchen. irbesartan (AVAPRO) 75 MG tablet Take 75 mg by mouth daily.  . norethindrone-ethinyl estradiol (LOESTRIN FE 1/20) 1-20 MG-MCG tablet Take 1 tablet by mouth daily.  . ondansetron (ZOFRAN) 4 MG tablet Take 1 tablet (4 mg total) by mouth every 6 (six) hours as needed for nausea.  Marland Kitchen. zolpidem (AMBIEN) 5 MG tablet Take 5 mg by mouth at bedtime as needed for sleep.  . cefpodoxime (VANTIN) 200 MG tablet Take 1 tablet (200 mg total) by mouth every 12 (twelve) hours. (Patient not taking: Reported on 08/08/2016)   No facility-administered encounter medications on file as of 08/08/2016.      Drug Allergies:  No Known Allergies  Family History: Kristen Klein's family history includes Alcohol abuse in her sister; Allergies in her son; Asthma in her son; Diabetes in her paternal grandmother; Hypertension in her father and mother; Hypothyroidism in her mother..There is no family history of eczema, hives, food allergies or lupus  Social and environmental. There are no pets in the home. She is not exposed to cigarette smoking. She  smoked cigarettes for 4 years as a teenager . She is a Engineer, sitemedical assistant in a doctor's office  Physical Exam: BP (!) 146/82 (BP Location: Right Arm, Patient Position: Sitting, Cuff Size: Normal)   Pulse 78   Temp 98.1 F (36.7 C) (Oral)   Resp 18  Ht 5\' 1"  (1.549 m)   Wt 179 lb 3.2 oz (81.3 kg)   SpO2 98%   BMI 33.86 kg/m    Physical Exam  Constitutional: She appears well-developed and well-nourished.  HENT:  Eyes normal. Ears normal. Nose normal. Pharynx normal.  Neck: Neck supple. No thyromegaly present.  Cardiovascular:  S1 and S2 normal no murmurs  Pulmonary/Chest:  Clear to percussion and auscultation  Abdominal: Soft.  Mild nonspecific tenderness in her lower abdomen. No tenderness elsewhere. No hepatosplenomegaly  Lymphadenopathy:    She has no cervical adenopathy.  Vitals  reviewed.   Diagnostics:  Metal patch tests applied  Assessment  Assessment and Plan: 1. Contact dermatitis due to chemicals   2. Essential hypertension        Patient Instructions  Patch testing instructions given Return in 48 hours for the first reading of her patch testing   Return in about 2 days (around 08/10/2016).   Thank you for the opportunity to care for this patient.  Please do not hesitate to contact me with questions.  Tonette BihariJ. A. Maki Sweetser, M.D.  [facility] [address] [place], [place] [address] [phone]

## 2016-08-10 ENCOUNTER — Ambulatory Visit: Payer: BLUE CROSS/BLUE SHIELD | Admitting: Allergy

## 2016-08-10 DIAGNOSIS — L253 Unspecified contact dermatitis due to other chemical products: Secondary | ICD-10-CM

## 2016-08-10 NOTE — Progress Notes (Signed)
    Follow-up Note  RE: Olin HauserStephanie E Meas MRN: [id number] DOB: 1985-06-21 Date of Office Visit: 08/10/2016  Primary care provider: Shea EvansMody, Vaishali, MD Referring provider: Shea EvansMody, Vaishali, MD   [name] returns to the office today for the initial patch test interpretation, given suspected history of contact dermatitis.    Diagnostics:  TRUE TEST 48 hour reading: There is mild erythema at the site of the titanium screws otherwise the rest of test is clear  Plan:  contact dermatitis The erythema at the site of the titanium screws is most likely an irritant effect. She will return in 2 days for final reading.   Margo AyeShaylar Padgett, MD [facility]

## 2016-08-12 ENCOUNTER — Ambulatory Visit: Payer: BLUE CROSS/BLUE SHIELD | Admitting: Allergy

## 2016-08-12 DIAGNOSIS — L253 Unspecified contact dermatitis due to other chemical products: Secondary | ICD-10-CM

## 2016-08-12 NOTE — Patient Instructions (Addendum)
Irritant contact dermatitis  TRUE TEST reading at 48hr was positive for irritation to titanium screws apart of the testing.    Plan:  Irritant contact dermatitis  - She did have irritation from the titanium screws in the testing otherwise no other metals were positive.  Would avoid titanium containing compounds as much as possible to prevent irritant effect.

## 2016-08-12 NOTE — Progress Notes (Signed)
    Follow-up Note  RE: Kristen HauserStephanie E Klein MRN: [id number] DOB: Nov 03, 1985 Date of Office Visit: 08/12/2016  Primary care provider: Shea EvansMody, Vaishali, MD Referring provider: Shea EvansMody, Vaishali, MD   [name] returns to the office today for the final patch test interpretation, given suspected history of contact dermatitis.    Diagnostics:  TRUE TEST 96 hour reading:  Back is clear  48hr reading: mild erythema at the site of the titanium screws otherwise the rest of test is clear  Plan:  Irritant contact dermatitis  - She did have irritation from the titanium screws in the testing otherwise no other metals were positive. Would avoid titanium containing compounds as much as possible to prevent irritant effect.   Margo AyeShaylar Padgett, MD [facility]

## 2017-06-10 DEATH — deceased
